# Patient Record
Sex: Female | Born: 1955 | Race: White | Hispanic: No | Marital: Married | Smoking: Never smoker
Health system: Southern US, Community
[De-identification: ages and names within clinical notes are randomized; demographics above are authoritative.]

## PROBLEM LIST (undated history)

## (undated) DIAGNOSIS — R51 Headache: Secondary | ICD-10-CM

## (undated) DIAGNOSIS — R519 Headache, unspecified: Secondary | ICD-10-CM

## (undated) DIAGNOSIS — K219 Gastro-esophageal reflux disease without esophagitis: Secondary | ICD-10-CM

## (undated) DIAGNOSIS — T753XXA Motion sickness, initial encounter: Secondary | ICD-10-CM

## (undated) DIAGNOSIS — E079 Disorder of thyroid, unspecified: Secondary | ICD-10-CM

## (undated) DIAGNOSIS — G4730 Sleep apnea, unspecified: Secondary | ICD-10-CM

## (undated) DIAGNOSIS — R42 Dizziness and giddiness: Secondary | ICD-10-CM

## (undated) HISTORY — DX: Disorder of thyroid, unspecified: E07.9

## (undated) HISTORY — PX: ABDOMINAL HYSTERECTOMY: SHX81

## (undated) HISTORY — DX: Sleep apnea, unspecified: G47.30

---

## 2004-09-23 ENCOUNTER — Ambulatory Visit: Payer: Self-pay | Admitting: Specialist

## 2005-08-05 ENCOUNTER — Ambulatory Visit: Payer: Self-pay

## 2007-07-29 HISTORY — PX: COLON SURGERY: SHX602

## 2008-05-04 ENCOUNTER — Ambulatory Visit: Payer: Self-pay | Admitting: Gastroenterology

## 2008-05-10 ENCOUNTER — Ambulatory Visit: Payer: Self-pay | Admitting: Family Medicine

## 2008-05-24 ENCOUNTER — Inpatient Hospital Stay: Payer: Self-pay | Admitting: General Surgery

## 2008-11-10 ENCOUNTER — Ambulatory Visit: Payer: Self-pay | Admitting: Family Medicine

## 2009-08-29 ENCOUNTER — Ambulatory Visit: Payer: Self-pay | Admitting: Family Medicine

## 2009-09-17 ENCOUNTER — Ambulatory Visit: Payer: Self-pay | Admitting: Family Medicine

## 2009-10-01 ENCOUNTER — Ambulatory Visit: Payer: Self-pay | Admitting: Gastroenterology

## 2009-10-04 DIAGNOSIS — M797 Fibromyalgia: Secondary | ICD-10-CM | POA: Insufficient documentation

## 2010-06-08 ENCOUNTER — Emergency Department: Payer: Self-pay | Admitting: Emergency Medicine

## 2010-10-16 ENCOUNTER — Ambulatory Visit: Payer: Self-pay | Admitting: Family Medicine

## 2011-08-26 ENCOUNTER — Ambulatory Visit: Payer: Self-pay | Admitting: Family Medicine

## 2012-10-19 ENCOUNTER — Ambulatory Visit: Payer: Self-pay | Admitting: Family Medicine

## 2014-01-03 ENCOUNTER — Encounter: Payer: Self-pay | Admitting: *Deleted

## 2014-01-23 ENCOUNTER — Encounter: Payer: Self-pay | Admitting: General Surgery

## 2014-01-24 ENCOUNTER — Ambulatory Visit: Payer: Self-pay | Admitting: General Surgery

## 2014-01-26 ENCOUNTER — Encounter: Payer: Self-pay | Admitting: *Deleted

## 2014-02-27 ENCOUNTER — Ambulatory Visit: Payer: Self-pay | Admitting: General Surgery

## 2014-03-23 ENCOUNTER — Encounter: Payer: Self-pay | Admitting: *Deleted

## 2015-01-08 ENCOUNTER — Other Ambulatory Visit: Payer: Self-pay | Admitting: Family Medicine

## 2015-01-08 NOTE — Telephone Encounter (Signed)
Pt needs refill

## 2015-01-08 NOTE — Telephone Encounter (Signed)
He needs to be seen, last visit in 05/2014 no labs since one year ago. I will send one month only

## 2015-01-23 ENCOUNTER — Other Ambulatory Visit: Payer: Self-pay | Admitting: Family Medicine

## 2015-01-23 DIAGNOSIS — Z79899 Other long term (current) drug therapy: Secondary | ICD-10-CM

## 2015-01-23 DIAGNOSIS — E785 Hyperlipidemia, unspecified: Secondary | ICD-10-CM

## 2015-01-23 DIAGNOSIS — R739 Hyperglycemia, unspecified: Secondary | ICD-10-CM

## 2015-01-23 DIAGNOSIS — E038 Other specified hypothyroidism: Secondary | ICD-10-CM

## 2015-01-23 NOTE — Telephone Encounter (Signed)
Pt needs a refill on her thyroid medication. She only has 2 left, but an appt sched for 03/05/15. She uses [facility] [address] She also needs to a lab slip generated so she can get her TSH checked before her appt. She would like for someone to call her and let her know when the slip is ready to pick up.

## 2015-01-24 ENCOUNTER — Other Ambulatory Visit: Payer: Self-pay | Admitting: Family Medicine

## 2015-01-24 ENCOUNTER — Other Ambulatory Visit: Payer: Self-pay

## 2015-01-24 DIAGNOSIS — E785 Hyperlipidemia, unspecified: Secondary | ICD-10-CM

## 2015-01-24 DIAGNOSIS — R739 Hyperglycemia, unspecified: Secondary | ICD-10-CM

## 2015-01-24 DIAGNOSIS — E038 Other specified hypothyroidism: Secondary | ICD-10-CM

## 2015-01-24 DIAGNOSIS — Z79899 Other long term (current) drug therapy: Secondary | ICD-10-CM

## 2015-01-24 MED ORDER — LEVOTHYROXINE SODIUM 88 MCG PO TABS: 88.0000 ug | ORAL_TABLET | Freq: Every day | ORAL | Status: DC

## 2015-01-24 NOTE — Progress Notes (Signed)
Patient notified and left lab slip upfront for patient to pick up.

## 2015-01-30 NOTE — Telephone Encounter (Signed)
Lab order done 

## 2015-01-30 NOTE — Addendum Note (Signed)
Addended by: Alba CorySOWLES, Megumi Treaster F on: 01/30/2015 08:21 AM   Modules accepted: Orders

## 2015-02-01 ENCOUNTER — Other Ambulatory Visit: Payer: Self-pay | Admitting: Family Medicine

## 2015-03-01 LAB — COMPREHENSIVE METABOLIC PANEL
A/G RATIO: 1.6 (ref 1.1–2.5)
ALT: 16 IU/L (ref 0–32)
AST: 18 IU/L (ref 0–40)
Albumin: 4.2 g/dL (ref 3.5–5.5)
Alkaline Phosphatase: 85 IU/L (ref 39–117)
BUN/Creatinine Ratio: 18 (ref 9–23)
BUN: 13 mg/dL (ref 6–24)
Bilirubin Total: 0.4 mg/dL (ref 0.0–1.2)
CHLORIDE: 100 mmol/L (ref 97–108)
CO2: 24 mmol/L (ref 18–29)
Calcium: 9.4 mg/dL (ref 8.7–10.2)
Creatinine, Ser: 0.74 mg/dL (ref 0.57–1.00)
GFR calc Af Amer: 103 mL/min/{1.73_m2} (ref >59)
GFR, EST NON AFRICAN AMERICAN: 89 mL/min/{1.73_m2} (ref >59)
Globulin, Total: 2.6 g/dL (ref 1.5–4.5)
Glucose: 97 mg/dL (ref 65–99)
POTASSIUM: 5.2 mmol/L (ref 3.5–5.2)
Sodium: 139 mmol/L (ref 134–144)
Total Protein: 6.8 g/dL (ref 6.0–8.5)

## 2015-03-01 LAB — HEMOGLOBIN A1C
ESTIMATED AVERAGE GLUCOSE: 111 mg/dL
HEMOGLOBIN A1C: 5.5 % (ref 4.8–5.6)

## 2015-03-01 LAB — LIPID PANEL
CHOL/HDL RATIO: 4.3 ratio (ref 0.0–4.4)
Cholesterol, Total: 208 mg/dL — ABNORMAL HIGH (ref 100–199)
HDL: 48 mg/dL (ref >39)
LDL CALC: 130 mg/dL — AB (ref 0–99)
Triglycerides: 149 mg/dL (ref 0–149)
VLDL Cholesterol Cal: 30 mg/dL (ref 5–40)

## 2015-03-01 LAB — TSH: TSH: 1.1 u[IU]/mL (ref 0.450–4.500)

## 2015-03-03 ENCOUNTER — Encounter: Payer: Self-pay | Admitting: Family Medicine

## 2015-03-03 DIAGNOSIS — E669 Obesity, unspecified: Secondary | ICD-10-CM | POA: Insufficient documentation

## 2015-03-03 DIAGNOSIS — E039 Hypothyroidism, unspecified: Secondary | ICD-10-CM | POA: Insufficient documentation

## 2015-03-03 DIAGNOSIS — Z90710 Acquired absence of both cervix and uterus: Secondary | ICD-10-CM | POA: Insufficient documentation

## 2015-03-03 DIAGNOSIS — Z8541 Personal history of malignant neoplasm of cervix uteri: Secondary | ICD-10-CM | POA: Insufficient documentation

## 2015-03-03 DIAGNOSIS — K219 Gastro-esophageal reflux disease without esophagitis: Secondary | ICD-10-CM | POA: Insufficient documentation

## 2015-03-03 DIAGNOSIS — E785 Hyperlipidemia, unspecified: Secondary | ICD-10-CM | POA: Insufficient documentation

## 2015-03-03 DIAGNOSIS — Z8679 Personal history of other diseases of the circulatory system: Secondary | ICD-10-CM | POA: Insufficient documentation

## 2015-03-03 DIAGNOSIS — K5909 Other constipation: Secondary | ICD-10-CM | POA: Insufficient documentation

## 2015-03-03 DIAGNOSIS — S0300XA Dislocation of jaw, unspecified side, initial encounter: Secondary | ICD-10-CM | POA: Insufficient documentation

## 2015-03-03 DIAGNOSIS — R945 Abnormal results of liver function studies: Secondary | ICD-10-CM | POA: Insufficient documentation

## 2015-03-03 DIAGNOSIS — M722 Plantar fascial fibromatosis: Secondary | ICD-10-CM | POA: Insufficient documentation

## 2015-03-03 DIAGNOSIS — R7989 Other specified abnormal findings of blood chemistry: Secondary | ICD-10-CM | POA: Insufficient documentation

## 2015-03-03 DIAGNOSIS — R739 Hyperglycemia, unspecified: Secondary | ICD-10-CM | POA: Insufficient documentation

## 2015-03-03 DIAGNOSIS — R160 Hepatomegaly, not elsewhere classified: Secondary | ICD-10-CM | POA: Insufficient documentation

## 2015-03-03 DIAGNOSIS — G4733 Obstructive sleep apnea (adult) (pediatric): Secondary | ICD-10-CM | POA: Insufficient documentation

## 2015-03-05 ENCOUNTER — Encounter (INDEPENDENT_AMBULATORY_CARE_PROVIDER_SITE_OTHER): Payer: Self-pay

## 2015-03-05 ENCOUNTER — Ambulatory Visit: Payer: Self-pay | Admitting: Family Medicine

## 2015-03-06 ENCOUNTER — Encounter: Payer: Self-pay | Admitting: Family Medicine

## 2015-03-06 ENCOUNTER — Ambulatory Visit (INDEPENDENT_AMBULATORY_CARE_PROVIDER_SITE_OTHER): Payer: BLUE CROSS/BLUE SHIELD | Admitting: Family Medicine

## 2015-03-06 VITALS — BP 130/78 | HR 91 | Temp 98.5°F | Resp 14 | Ht 65.0 in | Wt 206.5 lb

## 2015-03-06 DIAGNOSIS — E038 Other specified hypothyroidism: Secondary | ICD-10-CM

## 2015-03-06 DIAGNOSIS — K219 Gastro-esophageal reflux disease without esophagitis: Secondary | ICD-10-CM | POA: Diagnosis not present

## 2015-03-06 DIAGNOSIS — G4730 Sleep apnea, unspecified: Secondary | ICD-10-CM

## 2015-03-06 DIAGNOSIS — Z1239 Encounter for other screening for malignant neoplasm of breast: Secondary | ICD-10-CM | POA: Diagnosis not present

## 2015-03-06 DIAGNOSIS — E785 Hyperlipidemia, unspecified: Secondary | ICD-10-CM

## 2015-03-06 MED ORDER — LEVOTHYROXINE SODIUM 88 MCG PO TABS: 88.0000 ug | ORAL_TABLET | Freq: Every day | ORAL | Status: DC

## 2015-03-06 NOTE — Patient Instructions (Signed)

## 2015-03-06 NOTE — Progress Notes (Signed)
Name: Claudia Norris   MRN: [id number]    DOB: 1956/06/16   Date:03/06/2015       Progress Note  Subjective  Chief Complaint  Chief Complaint  Patient presents with  . Hypothyroidism  . Sleep Apnea    has been diagnosed but cannot wear the mask    HPI  Hypothyroidism: she is compliant with medications, TSH is at goal. Denies constipation, no hair loss, no dry skin.  Sleep Apnea: she had two titration studies, but never started on CPAP because of cost, she will check with insurance to see if she can get a home study and will call us back. She still snores, denies waking up with headaches.   Dyslipidemia: LDL 130, calculated her ASCVD and was 3.5% , we will not start statin  GERD: she has been taking Tums prn  at night to control symptoms. She has regurgitation about once a month  Patient Active Problem List   Diagnosis Date Noted  . Chronic constipation 03/03/2015  . Dyslipidemia 03/03/2015  . Gastro-esophageal reflux disease without esophagitis 03/03/2015  . Hepatomegaly 03/03/2015  . History of cervical cancer 03/03/2015  . H/O: HTN (hypertension) 03/03/2015  . H/O: hysterectomy 03/03/2015  . Blood glucose elevated 03/03/2015  . Adult hypothyroidism 03/03/2015  . Obesity (BMI 30.0-34.9) 03/03/2015  . Plantar fasciitis 03/03/2015  . Apnea, sleep 03/03/2015  . Closed dislocation of jaw 03/03/2015  . Fibromyalgia syndrome 10/04/2009    Past Surgical History  Procedure Laterality Date  . Abdominal hysterectomy    . Cesarean section    . Colon surgery  2009    removed some of colon    Family History  Problem Relation Age of Onset  . Hypertension Mother   . Hypothyroidism Mother   . Heart disease Mother     bypass  . Stroke Mother   . Arthritis Father     History   Social History  . Marital Status: Married    Spouse Name: N/A  . Number of Children: N/A  . Years of Education: N/A   Occupational History  . Not on file.   Social History Main Topics  .  Smoking status: Never Smoker   . Smokeless tobacco: Never Used  . Alcohol Use: No  . Drug Use: No  . Sexual Activity: No   Other Topics Concern  . Not on file   Social History Narrative  . No narrative on file     Current outpatient prescriptions:  .  levothyroxine (SYNTHROID) 88 MCG tablet, Take 1 tablet (88 mcg total) by mouth daily before breakfast., Disp: 90 tablet, Rfl: 1  No Known Allergies   ROS  Constitutional: Negative for fever or weight change.  Respiratory: Negative for cough and shortness of breath.   Cardiovascular: Negative for chest pain or palpitations.  Gastrointestinal: Negative for abdominal pain, no bowel changes.  Musculoskeletal: Negative for gait problem or joint swelling.  Skin: Negative for rash.  Neurological: Negative for dizziness or headache.  No other specific complaints in a complete review of systems (except as listed in HPI above).  Objective  Filed Vitals:   03/06/15 1048  BP: 130/78  Pulse: 91  Temp: 98.5 F (36.9 C)  TempSrc: Oral  Resp: 14  Height: 5\' 5"  (1.651 m)  Weight: 206 lb 8 oz (93.668 kg)  SpO2: 96%    Body mass index is 34.36 kg/(m^2).  Physical Exam   Constitutional: Patient appears well-developed and well-nourished. Obese  No distress.  HEENT: head  atraumatic, normocephalic, pupils equal and reactive to light,  neck supple, throat within normal limits Cardiovascular: Normal rate, regular rhythm and normal heart sounds.  No murmur heard. No BLE edema. Pulmonary/Chest: Effort normal and breath sounds normal. No respiratory distress. Abdominal: Soft.  There is no tenderness. Psychiatric: Patient has a normal mood and affect. behavior is normal. Judgment and thought content normal.  Recent Results (from the past 2160 hour(s))  TSH     Status: None   Collection Time: 02/28/15 10:56 AM  Result Value Ref Range   TSH 1.100 0.450 - 4.500 uIU/mL  Lipid Profile     Status: Abnormal   Collection Time: 02/28/15 10:56  AM  Result Value Ref Range   Cholesterol, Total 208 (H) 100 - 199 mg/dL   Triglycerides 161 0 - 149 mg/dL   HDL 48 >09 mg/dL    Comment: According to ATP-III Guidelines, HDL-C >59 mg/dL is considered a negative risk factor for CHD.    VLDL Cholesterol Cal 30 5 - 40 mg/dL   LDL Calculated 604 (H) 0 - 99 mg/dL   Chol/HDL Ratio 4.3 0.0 - 4.4 ratio units    Comment:                                   T. Chol/HDL Ratio                                             Men  Women                               1/2 Avg.Risk  3.4    3.3                                   Avg.Risk  5.0    4.4                                2X Avg.Risk  9.6    7.1                                3X Avg.Risk 23.4   11.0   Comprehensive Metabolic Panel (CMET)     Status: None   Collection Time: 02/28/15 10:56 AM  Result Value Ref Range   Glucose 97 65 - 99 mg/dL   BUN 13 6 - 24 mg/dL   Creatinine, Ser 5.40 0.57 - 1.00 mg/dL   GFR calc non Af Amer 89 >59 mL/min/1.73   GFR calc Af Amer 103 >59 mL/min/1.73   BUN/Creatinine Ratio 18 9 - 23   Sodium 139 134 - 144 mmol/L   Potassium 5.2 3.5 - 5.2 mmol/L   Chloride 100 97 - 108 mmol/L   CO2 24 18 - 29 mmol/L   Calcium 9.4 8.7 - 10.2 mg/dL   Total Protein 6.8 6.0 - 8.5 g/dL   Albumin 4.2 3.5 - 5.5 g/dL   Globulin, Total 2.6 1.5 - 4.5 g/dL   Albumin/Globulin Ratio 1.6 1.1 - 2.5   Bilirubin Total 0.4 0.0 - 1.2 mg/dL  Alkaline Phosphatase 85 39 - 117 IU/L   AST 18 0 - 40 IU/L   ALT 16 0 - 32 IU/L  HgB A1c     Status: None   Collection Time: 02/28/15 10:56 AM  Result Value Ref Range   Hgb A1c MFr Bld 5.5 4.8 - 5.6 %    Comment:          Pre-diabetes: 5.7 - 6.4          Diabetes: >6.4          Glycemic control for adults with diabetes: <7.0    Est. average glucose Bld gHb Est-mCnc 111 mg/dL     ZOX0/9: Depression screen PHQ 2/9 03/06/2015  Decreased Interest 0  Down, Depressed, Hopeless 0  PHQ - 2 Score 0     Fall Risk: Fall Risk  03/06/2015  Falls in the  past year? No      Assessment & Plan  1. Apnea, sleep Explained importance of starting CPAP to decrease chance of MI and CVA  2. Gastro-esophageal reflux disease without esophagitis Taking Tums prn and is doing well  3. Dyslipidemia Discussed life style modification  4. Other specified hypothyroidism TSH is at goal  - levothyroxine (SYNTHROID) 88 MCG tablet; Take 1 tablet (88 mcg total) by mouth daily before breakfast.  Dispense: 90 tablet; Refill: 1  5. Breast cancer screening  - MM Digital Screening; Future

## 2015-06-26 ENCOUNTER — Telehealth: Payer: Self-pay | Admitting: Family Medicine

## 2015-06-26 ENCOUNTER — Other Ambulatory Visit: Payer: Self-pay

## 2015-06-26 NOTE — Telephone Encounter (Signed)
Patient is completely out of Synthroid. She states that [facility] and realized that it is not working for her, therefore she would like the name brand Synthroid. States that you would probably have to call the prescription in because of change. [facility] [address]

## 2015-06-27 ENCOUNTER — Other Ambulatory Visit: Payer: Self-pay | Admitting: Family Medicine

## 2015-06-27 DIAGNOSIS — E038 Other specified hypothyroidism: Secondary | ICD-10-CM

## 2015-06-27 MED ORDER — SYNTHROID 88 MCG PO TABS: 88.0000 ug | ORAL_TABLET | Freq: Every day | ORAL | Status: DC

## 2015-06-27 NOTE — Progress Notes (Signed)
Sent 30 days, but she needs follow up

## 2015-06-27 NOTE — Telephone Encounter (Signed)
LMOM to inform pt °

## 2015-06-27 NOTE — Telephone Encounter (Signed)
Sent 30 days, but needs follow up 

## 2015-06-28 NOTE — Progress Notes (Signed)
Called to inform patient to schedule appointment that her prescription has been called in. She wanted to ask you why does she have to come back with in 30 days. Stated that she was just seen in August. I informed her that most people have to be seen at least every 3-4 months and she stated that it should be every 6-12 months that it becomes to expensive coming in all the time. I told her that I will ask you and someone will return her call.  Please advise.

## 2015-06-28 NOTE — Progress Notes (Signed)
On my note it says return in 4 months, but we can change, return in Feb.

## 2015-08-01 ENCOUNTER — Telehealth: Payer: Self-pay

## 2015-08-01 NOTE — Telephone Encounter (Signed)
Yes, she can get all 3 vaccines at the same time

## 2015-08-01 NOTE — Telephone Encounter (Signed)
Spoke with pt and she stated she will be in next week for her influenza vaccination but she also wanted to know about getting th pneumococcal and varicella zoster. She asked if she could get there Varicella Zoster injection since she has never had the Chicken Pox and with her husband currently having Shingles. I told her that I would ask Dr. Carlynn PurlSowles and will give her a call back.

## 2015-08-02 ENCOUNTER — Telehealth: Payer: Self-pay

## 2015-08-02 NOTE — Telephone Encounter (Signed)
Patient was informed of Dr. Arty BaumgartnerSowles's message (Yes, she can get all 3 vaccines at the same time) and she said thank you for calling.

## 2015-09-24 ENCOUNTER — Other Ambulatory Visit: Payer: Self-pay | Admitting: Family Medicine

## 2015-09-24 DIAGNOSIS — E038 Other specified hypothyroidism: Secondary | ICD-10-CM

## 2015-09-24 NOTE — Telephone Encounter (Signed)
Appointment made for 10-22-15. She is also requesting to pick up a lab order the week before her appointment

## 2015-09-24 NOTE — Telephone Encounter (Signed)
Ordered TSH, all of her labs were done in 08 and there is no need to repeat it at this time

## 2015-09-24 NOTE — Addendum Note (Signed)
Addended by: Alba Cory F on: 09/24/2015 04:40 PM   Modules accepted: Orders

## 2015-09-24 NOTE — Telephone Encounter (Signed)
Patient requesting refill. 

## 2015-10-02 ENCOUNTER — Other Ambulatory Visit: Payer: Self-pay

## 2015-10-02 NOTE — Telephone Encounter (Signed)
Patient called stating she was not able to pick up her medication due to it being generic and [employer] will only pay for Brand not generic.  Refill request was sent to Dr. Alba CoryKrichna Sowles for approval and submission.

## 2015-10-03 MED ORDER — SYNTHROID 88 MCG PO TABS: 88.0000 ug | ORAL_TABLET | Freq: Every day | ORAL | Status: DC

## 2015-10-22 ENCOUNTER — Ambulatory Visit: Payer: BLUE CROSS/BLUE SHIELD | Admitting: Family Medicine

## 2015-10-30 ENCOUNTER — Encounter: Payer: Self-pay | Admitting: Family Medicine

## 2015-10-30 ENCOUNTER — Ambulatory Visit (INDEPENDENT_AMBULATORY_CARE_PROVIDER_SITE_OTHER): Payer: BLUE CROSS/BLUE SHIELD | Admitting: Family Medicine

## 2015-10-30 ENCOUNTER — Other Ambulatory Visit: Payer: Self-pay | Admitting: Family Medicine

## 2015-10-30 VITALS — BP 118/68 | HR 82 | Temp 98.7°F | Resp 18 | Ht 65.0 in | Wt 196.6 lb

## 2015-10-30 DIAGNOSIS — G4730 Sleep apnea, unspecified: Secondary | ICD-10-CM | POA: Diagnosis not present

## 2015-10-30 DIAGNOSIS — E669 Obesity, unspecified: Secondary | ICD-10-CM | POA: Diagnosis not present

## 2015-10-30 DIAGNOSIS — E785 Hyperlipidemia, unspecified: Secondary | ICD-10-CM

## 2015-10-30 DIAGNOSIS — E038 Other specified hypothyroidism: Secondary | ICD-10-CM

## 2015-10-30 DIAGNOSIS — Z23 Encounter for immunization: Secondary | ICD-10-CM

## 2015-10-30 DIAGNOSIS — E66811 Obesity, class 1: Secondary | ICD-10-CM

## 2015-10-30 DIAGNOSIS — K219 Gastro-esophageal reflux disease without esophagitis: Secondary | ICD-10-CM | POA: Diagnosis not present

## 2015-10-30 LAB — TSH: TSH: 0.289 u[IU]/mL — AB (ref 0.450–4.500)

## 2015-10-30 NOTE — Progress Notes (Signed)
Name: Claudia Norris   MRN: [id number]    DOB: 1955-08-25   Date:10/30/2015       Progress Note  Subjective  Chief Complaint  Chief Complaint  Patient presents with  . Thyroid Problem    pt here for medication refill and to discuss resent results    HPI  Hypothyroidism: she is compliant with medications, TSH is suppressed.  She has lost 10 lbs since last visit. She denies tachycardia or palpitation  Sleep Apnea: she had two titration studies, but never started on CPAP because of cost, she checked with insurance but does not want the mask will discuss with her dentist. She still snores, not having headaches.   Dyslipidemia: LDL 130, calculated her ASCVD and was 3.5% , we will not start statin  GERD: she has been taking Tums prnat most once a week  at night to control symptoms. She has regurgitation about once a month  Obesity: she has lost 10 lbs, she states her mother was diagnosed with anal cancer about 10 years ago. 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 Husband is starting a new business. She is also walking daily and trying to take care of herself.    Patient Active Problem List   Diagnosis Date Noted  . Dyslipidemia 03/03/2015  . Gastro-esophageal reflux disease without esophagitis 03/03/2015  . Hepatomegaly 03/03/2015  . History of cervical cancer 03/03/2015  . H/O: HTN (hypertension) 03/03/2015  . H/O: hysterectomy 03/03/2015  . Blood glucose elevated 03/03/2015  . Adult hypothyroidism 03/03/2015  . Obesity (BMI 30.0-34.9) 03/03/2015  . Plantar fasciitis 03/03/2015  . Apnea, sleep 03/03/2015  . Closed dislocation of jaw 03/03/2015  . Fibromyalgia syndrome 10/04/2009    Past Surgical History  Procedure Laterality Date  . Abdominal hysterectomy    . Cesarean section    . Colon surgery  2009    removed some of colon    Family History  Problem Relation Age of Onset  . Hypertension  Mother   . Hypothyroidism Mother   . Heart disease Mother     bypass  . Stroke Mother   . Cancer - Other Mother   . Arthritis Father     Social History   Social History  . Marital Status: Married    Spouse Name: N/A  . Number of Children: N/A  . Years of Education: N/A   Occupational History  . Not on file.   Social History Main Topics  . Smoking status: Never Smoker   . Smokeless tobacco: Never Used  . Alcohol Use: No  . Drug Use: No  . Sexual Activity: No   Other Topics Concern  . Not on file   Social History Narrative     Current outpatient prescriptions:  .  SYNTHROID 88 MCG tablet, Take 1 tablet (88 mcg total) by mouth daily before breakfast., Disp: 90 tablet, Rfl: 1  No Known Allergies   ROS  Constitutional: Negative for fever, positive for  weight change.  Respiratory: Negative for cough and shortness of breath.   Cardiovascular: Negative for chest pain or palpitations.  Gastrointestinal: Negative for abdominal pain, no bowel changes.  Musculoskeletal: Negative for gait problem or joint swelling.  Skin: Negative for rash.  Neurological: Negative for dizziness or headache.  No other specific complaints in a complete review of systems (except as listed in HPI above).  Objective  Filed Vitals:  10/30/15 1052  BP: 118/68  Pulse: 82  Temp: 98.7 F (37.1 C)  Resp: 18  Height:  (1.651 m)  Weight: 196 lb 9 oz (89.16 kg)  SpO2: 97%    Body mass index is 32.71 kg/(m^2).  Physical Exam  Constitutional: Patient appears well-developed and well-nourished. Obese No distress.  HEENT: head atraumatic, normocephalic, pupils equal and reactive to light,  neck supple, throat within normal limits Cardiovascular: Normal rate, regular rhythm and normal heart sounds.  No murmur heard. No BLE edema. Pulmonary/Chest: Effort normal and breath sounds normal. No respiratory distress. Abdominal: Soft.  There is no tenderness. Psychiatric: Patient has a normal  mood and affect. behavior is normal. Judgment and thought content normal.  Recent Results (from the past 2160 hour(s))  TSH     Status: Abnormal   Collection Time: 10/29/15 10:15 AM  Result Value Ref Range   TSH 0.289 (L) 0.450 - 4.500 uIU/mL    PHQ2/9: Depression screen PHQ 2/9 03/06/2015  Decreased Interest 0  Down, Depressed, Hopeless 0  PHQ - 2 Score 0     Fall Risk: Fall Risk  03/06/2015  Falls in the past year? No     Assessment & Plan  1. Other specified hypothyroidism  - TSH  2. Dyslipidemia  On diet only, she lost weight  3. Apnea, sleep  She refuses CPAP   4. Gastro-esophageal reflux disease without esophagitis  Doing well on Tums prn   5. Obesity (BMI 30.0-34.9)  Discussed with the patient the risk posed by an increased BMI. Discussed importance of portion control, calorie counting and at least 150 minutes of physical activity weekly. Avoid sweet beverages and drink more water. Eat at least 6 servings of fruit and vegetables daily   6. Need for shingles vaccine  - Varicella-zoster vaccine subcutaneous  7. Needs flu shot  - Flu Vaccine QUAD 36+ mos IM

## 2015-11-06 ENCOUNTER — Other Ambulatory Visit: Payer: Self-pay | Admitting: Family Medicine

## 2015-11-06 NOTE — Telephone Encounter (Signed)
Patient requesting refill. 

## 2016-02-14 ENCOUNTER — Other Ambulatory Visit: Payer: Self-pay

## 2016-02-14 NOTE — Telephone Encounter (Signed)
Got a fax from [facility] stating this patient is requesting a refill of her Synthroid 88mcg, must be brand name.  Refill request was sent to Dr. Alba CoryKrichna Sowles for approval and submission.

## 2016-02-29 ENCOUNTER — Telehealth: Payer: Self-pay | Admitting: Gastroenterology

## 2016-02-29 ENCOUNTER — Other Ambulatory Visit: Payer: Self-pay | Admitting: Family Medicine

## 2016-02-29 ENCOUNTER — Other Ambulatory Visit: Payer: Self-pay

## 2016-02-29 ENCOUNTER — Ambulatory Visit (INDEPENDENT_AMBULATORY_CARE_PROVIDER_SITE_OTHER): Payer: BLUE CROSS/BLUE SHIELD | Admitting: Family Medicine

## 2016-02-29 ENCOUNTER — Encounter: Payer: Self-pay | Admitting: Family Medicine

## 2016-02-29 VITALS — BP 120/70 | HR 81 | Temp 98.4°F | Resp 16 | Ht 65.0 in | Wt 196.2 lb

## 2016-02-29 DIAGNOSIS — E669 Obesity, unspecified: Secondary | ICD-10-CM

## 2016-02-29 DIAGNOSIS — Z1211 Encounter for screening for malignant neoplasm of colon: Secondary | ICD-10-CM | POA: Diagnosis not present

## 2016-02-29 DIAGNOSIS — R8290 Unspecified abnormal findings in urine: Secondary | ICD-10-CM | POA: Diagnosis not present

## 2016-02-29 DIAGNOSIS — Z1239 Encounter for other screening for malignant neoplasm of breast: Secondary | ICD-10-CM | POA: Diagnosis not present

## 2016-02-29 DIAGNOSIS — Z01419 Encounter for gynecological examination (general) (routine) without abnormal findings: Secondary | ICD-10-CM

## 2016-02-29 DIAGNOSIS — E038 Other specified hypothyroidism: Secondary | ICD-10-CM

## 2016-02-29 DIAGNOSIS — Z800 Family history of malignant neoplasm of digestive organs: Secondary | ICD-10-CM | POA: Insufficient documentation

## 2016-02-29 DIAGNOSIS — Z131 Encounter for screening for diabetes mellitus: Secondary | ICD-10-CM

## 2016-02-29 DIAGNOSIS — Z0000 Encounter for general adult medical examination without abnormal findings: Secondary | ICD-10-CM

## 2016-02-29 DIAGNOSIS — Z8541 Personal history of malignant neoplasm of cervix uteri: Secondary | ICD-10-CM | POA: Diagnosis not present

## 2016-02-29 DIAGNOSIS — G4730 Sleep apnea, unspecified: Secondary | ICD-10-CM

## 2016-02-29 DIAGNOSIS — K219 Gastro-esophageal reflux disease without esophagitis: Secondary | ICD-10-CM

## 2016-02-29 DIAGNOSIS — E785 Hyperlipidemia, unspecified: Secondary | ICD-10-CM

## 2016-02-29 LAB — COMPLETE METABOLIC PANEL WITH GFR
ALBUMIN: 3.9 g/dL (ref 3.6–5.1)
ALK PHOS: 76 U/L (ref 33–130)
ALT: 16 U/L (ref 6–29)
AST: 19 U/L (ref 10–35)
BILIRUBIN TOTAL: 0.4 mg/dL (ref 0.2–1.2)
BUN: 15 mg/dL (ref 7–25)
CO2: 26 mmol/L (ref 20–31)
CREATININE: 0.78 mg/dL (ref 0.50–0.99)
Calcium: 9 mg/dL (ref 8.6–10.4)
Chloride: 105 mmol/L (ref 98–110)
GFR, Est African American: >89 mL/min (ref >=60)
GFR, Est Non African American: 83 mL/min (ref >=60)
GLUCOSE: 99 mg/dL (ref 65–99)
Potassium: 4.4 mmol/L (ref 3.5–5.3)
SODIUM: 138 mmol/L (ref 135–146)
TOTAL PROTEIN: 6.6 g/dL (ref 6.1–8.1)

## 2016-02-29 LAB — LIPID PANEL
Cholesterol: 186 mg/dL (ref 125–200)
HDL: 55 mg/dL (ref >=46)
LDL CALC: 109 mg/dL (ref <130)
Total CHOL/HDL Ratio: 3.4 Ratio (ref <=5.0)
Triglycerides: 111 mg/dL (ref <150)
VLDL: 22 mg/dL (ref <30)

## 2016-02-29 LAB — TSH: TSH: 0.34 m[IU]/L — AB

## 2016-02-29 LAB — HEMOGLOBIN A1C
HEMOGLOBIN A1C: 5.4 % (ref <5.7)
MEAN PLASMA GLUCOSE: 108 mg/dL

## 2016-02-29 MED ORDER — NA SULFATE-K SULFATE-MG SULF 17.5-3.13-1.6 GM/177ML PO SOLN: 1.0000 | Freq: Once | ORAL | 0 refills | Status: AC

## 2016-02-29 NOTE — Telephone Encounter (Signed)
colonoscopy

## 2016-02-29 NOTE — Telephone Encounter (Signed)
Called patient on her mobile and left a message. Home phone is actually her husbands cell phone

## 2016-02-29 NOTE — Progress Notes (Signed)
Name: Claudia Norris   MRN: [id number]    DOB: [birth year]   Date:02/29/2016       Progress Note  Subjective  Chief Complaint  Chief Complaint  Patient presents with  . Annual Exam  . 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 She is now looking for a part time job locally. Her mother is doing well at this time. She has a history of cervical cancer and needs repeat of pap smear. She is also due for mammogram and colonoscopy. Last intercourse about 6 months ago because of vaginal dryness and discomfort - but intimate with husband in other ways.   Dyslipidemia: on life style modification only, trying to walk more, no chest pain.   Hypothyroidism: taking Synthroid 88 mcg daily, but she is supposed to take half on Sundays. She denies palpitation or weight loss.   GERD: she has occasional regurgitation and heartburn, she takes Tums prn. Usually triggered by what she its.   Obesity: weight is stable now, she is trying to walk 5 days weekly, she is trying to eat more salads, and is trying to cut back on potato chips.   Urine odor and intermittent frequency: she has a history of frequent UTI, she has noticed if she drinks more water the odor improves. She denies dysuria, fever or back pain.    Patient Active Problem List   Diagnosis Date Noted  . Family history of rectal cancer 02/29/2016  . Dyslipidemia 03/03/2015  . Gastro-esophageal reflux disease without esophagitis 03/03/2015  . Hepatomegaly 03/03/2015  . History of cervical cancer 03/03/2015  . H/O: HTN (hypertension) 03/03/2015  . H/O: hysterectomy 03/03/2015  . Blood glucose elevated 03/03/2015  . Adult hypothyroidism 03/03/2015  . Obesity (BMI 30.0-34.9) 03/03/2015  . Plantar  fasciitis 03/03/2015  . Apnea, sleep 03/03/2015  . Closed dislocation of jaw 03/03/2015  . Fibromyalgia syndrome 10/04/2009    Past Surgical History:  Procedure Laterality Date  . ABDOMINAL HYSTERECTOMY    . CESAREAN SECTION    . COLON SURGERY  2009   removed some of colon    Family History  Problem Relation Age of Onset  . Hypertension Mother   . Hypothyroidism Mother   . Heart disease Mother     bypass  . Stroke Mother   . Cancer - Other Mother   . Arthritis Father     Social History   Social History  . Marital status: Married    Spouse name: N/A  . Number of children: N/A  . Years of education: N/A   Occupational History  . Not on file.   Social History Main Topics  . Smoking status: Never Smoker  . Smokeless tobacco: Never Used  . Alcohol use No  . Drug use: No  . Sexual activity: No   Other Topics Concern  . Not on file   Social History Narrative  . No narrative on file     Current Outpatient Prescriptions:  .  SYNTHROID 88 MCG tablet, Take 1 tablet (88 mcg total) by mouth daily before breakfast. And half on Sundays, Disp: 30 tablet, Rfl: 0  No Known Allergies   ROS  Constitutional: Negative  for fever or weight change.  Respiratory: Negative for cough and shortness of breath.   Cardiovascular: Negative for chest pain or palpitations.  Gastrointestinal: Negative for abdominal pain, no bowel changes.  Musculoskeletal: Negative for gait problem or joint swelling.  Skin: Negative for rash.  Neurological: Negative for dizziness or headache.  No other specific complaints in a complete review of systems (except as listed in HPI above).  Objective  Vitals:   02/29/16 0818  BP: 120/70  Pulse: 81  Resp: 16  Temp: 98.4 F (36.9 C)  TempSrc: Oral  SpO2: 97%  Weight: 196 lb 3.2 oz (89 kg)  Height: 5\' 5"  (1.651 m)    Body mass index is 32.65 kg/m.  Physical Exam  Constitutional: Patient appears well-developed and well-nourished. No  distress.  HENT: Head: Normocephalic and atraumatic. Ears: B TMs ok, no erythema or effusion; Nose: Nose normal. Mouth/Throat: Oropharynx is clear and moist. No oropharyngeal exudate.  Eyes: Conjunctivae and EOM are normal. Pupils are equal, round, and reactive to light. No scleral icterus.  Neck: Normal range of motion. Neck supple. No JVD present. No thyromegaly present.  Cardiovascular: Normal rate, regular rhythm and normal heart sounds.  No murmur heard. No BLE edema. Pulmonary/Chest: Effort normal and breath sounds normal. No respiratory distress. Abdominal: Soft. Bowel sounds are normal, no distension. There is no tenderness. no masses Breast: no lumps or masses, no nipple discharge or rashes FEMALE GENITALIA:  External genitalia normal External urethra normal Vaginal vault normal without discharge or lesions Cervix absent  Bimanual exam normal without masses RECTAL: not done Musculoskeletal: Normal range of motion, no joint effusions. No gross deformities Neurological: he is alert and oriented to person, place, and time. No cranial nerve deficit. Coordination, balance, strength, speech and gait are normal.  Skin: Skin is warm and dry. No rash noted. No erythema.  Psychiatric: Patient has a normal mood and affect. behavior is normal. Judgment and thought content normal.   PHQ2/9: Depression screen PHQ 2/9 03/06/2015  Decreased Interest 0  Down, Depressed, Hopeless 0  PHQ - 2 Score 0     Fall Risk: Fall Risk  03/06/2015  Falls in the past year? No     Functional Status Survey: Is the patient deaf or have difficulty hearing?: No Does the patient have difficulty seeing, even when wearing glasses/contacts?: No Does the patient have difficulty concentrating, remembering, or making decisions?: No Does the patient have difficulty walking or climbing stairs?: No Does the patient have difficulty dressing or bathing?: No Does the patient have difficulty doing errands alone such as  visiting a doctor's office or shopping?: No    Assessment & Plan  1. Well woman exam  Discussed importance of 150 minutes of physical activity weekly, eat two servings of fish weekly, eat one serving of tree nuts ( cashews, pistachios, pecans, almonds.Marland Kitchen) every other day, eat 6 servings of fruit/vegetables daily and drink plenty of water and avoid sweet beverages.  - COMPLETE METABOLIC PANEL WITH GFR  2. Dyslipidemia  - Lipid panel  3. Apnea, sleep  She could not tolerate mask  4. Gastro-esophageal reflux disease without esophagitis  Continue prn Tums  5. Breast cancer screening  - MM Digital Screening; Future  6. Obesity (BMI 30.0-34.9)  Discussed with the patient the risk posed by an increased BMI. Discussed importance of portion control, calorie counting and at least 150 minutes of physical activity weekly. Avoid sweet beverages and drink more water. Eat at least 6 servings of fruit and vegetables  daily   7. Family history of rectal cancer  - Ambulatory referral to Gastroenterology  8. History of cervical cancer  - PapLb, HPV, rfx16/18  9. Colon cancer screening  - Ambulatory referral to Gastroenterology  10. DM (diabetes mellitus screen)  - Hemoglobin A1c  11. Other specified hypothyroidism  - TSH  12. Bad odor of urine  - Urine Culture

## 2016-02-29 NOTE — Telephone Encounter (Signed)
Screening Colonoscopy Z12.11 [facility] 04/18/2016 Please pre cert  

## 2016-02-29 NOTE — Telephone Encounter (Signed)
Gastroenterology Pre-Procedure Review  Request Date: 04/18/2016 Requesting Physician: Dr. Carlynn Purl   PATIENT REVIEW QUESTIONS: The patient responded to the following health history questions as indicated:    1. Are you having any GI issues? no 2. Do you have a personal history of Polyps? yes (pre cancerous ) 3. Do you have a family history of Colon Cancer or Polyps? yes (Rectal cancer) 4. Diabetes Mellitus? no 5. Joint replacements in the past 12 months?no 6. Major health problems in the past 3 months?no 7. Any artificial heart valves, MVP, or defibrillator?no    MEDICATIONS & ALLERGIES:    Patient reports the following regarding taking any anticoagulation/antiplatelet therapy:   Plavix, Coumadin, Eliquis, Xarelto, Lovenox, Pradaxa, Brilinta, or Effient? no Aspirin? no  Patient confirms/reports the following medications:  Current Outpatient Prescriptions  Medication Sig Dispense Refill  . SYNTHROID 88 MCG tablet Take 1 tablet (88 mcg total) by mouth daily before breakfast. And half on Sundays 30 tablet 0   No current facility-administered medications for this visit.     Patient confirms/reports the following allergies:  No Known Allergies  No orders of the defined types were placed in this encounter.   AUTHORIZATION INFORMATION Primary Insurance: 1D#: Group #:  Secondary Insurance: 1D#: Group #:  SCHEDULE INFORMATION: Date:  Time: Location:

## 2016-03-03 LAB — URINE CULTURE: Colony Count: >=100000

## 2016-03-05 LAB — PAPLB, HPV, RFX16/18
HPV, high-risk: NEGATIVE
PAP SMEAR COMMENT: 0

## 2016-03-07 ENCOUNTER — Other Ambulatory Visit: Payer: Self-pay | Admitting: Family Medicine

## 2016-03-07 DIAGNOSIS — N390 Urinary tract infection, site not specified: Secondary | ICD-10-CM | POA: Insufficient documentation

## 2016-03-07 DIAGNOSIS — E038 Other specified hypothyroidism: Secondary | ICD-10-CM

## 2016-03-07 MED ORDER — SYNTHROID 88 MCG PO TABS: 88.0000 ug | ORAL_TABLET | Freq: Every day | ORAL | 0 refills | Status: DC

## 2016-03-07 MED ORDER — NITROFURANTOIN MONOHYD MACRO 100 MG PO CAPS: 100.0000 mg | ORAL_CAPSULE | Freq: Two times a day (BID) | ORAL | 0 refills | Status: DC

## 2016-03-18 ENCOUNTER — Encounter: Payer: Self-pay | Admitting: Family Medicine

## 2016-03-18 ENCOUNTER — Ambulatory Visit: Payer: BLUE CROSS/BLUE SHIELD | Admitting: Family Medicine

## 2016-03-18 VITALS — BP 116/68 | HR 92 | Temp 99.1°F | Resp 18 | Ht 65.0 in | Wt 197.0 lb

## 2016-03-18 DIAGNOSIS — Z8541 Personal history of malignant neoplasm of cervix uteri: Secondary | ICD-10-CM

## 2016-03-18 NOTE — Progress Notes (Signed)
Name: Claudia Norris   MRN: [id number]    DOB: Oct 17, 1955   Date:03/18/2016       Progress Note  Subjective  Chief Complaint  Chief Complaint  Patient presents with  . Gynecologic Exam    return pap    HPI  History of cervical cancer and pap smear was unsatisfactory of evaluation on 03/10/2016 , she returned today for a repeat pap smear  Patient Active Problem List   Diagnosis Date Noted  . Urinary tract infection 03/07/2016  . Family history of rectal cancer 02/29/2016  . Dyslipidemia 03/03/2015  . Gastro-esophageal reflux disease without esophagitis 03/03/2015  . Hepatomegaly 03/03/2015  . History of cervical cancer 03/03/2015  . H/O: HTN (hypertension) 03/03/2015  . H/O: hysterectomy 03/03/2015  . Blood glucose elevated 03/03/2015  . Adult hypothyroidism 03/03/2015  . Obesity (BMI 30.0-34.9) 03/03/2015  . Plantar fasciitis 03/03/2015  . Apnea, sleep 03/03/2015  . Closed dislocation of jaw 03/03/2015  . Fibromyalgia syndrome 10/04/2009    Past Surgical History:  Procedure Laterality Date  . ABDOMINAL HYSTERECTOMY    . CESAREAN SECTION    . COLON SURGERY  2009   removed some of colon    Family History  Problem Relation Age of Onset  . Hypertension Mother   . Hypothyroidism Mother   . Heart disease Mother     bypass  . Stroke Mother   . Cancer - Other Mother   . Arthritis Father     Social History   Social History  . Marital status: Married    Spouse name: N/A  . Number of children: N/A  . Years of education: N/A   Occupational History  . Not on file.   Social History Main Topics  . Smoking status: Never Smoker  . Smokeless tobacco: Never Used  . Alcohol use No  . Drug use: No  . Sexual activity: No   Other Topics Concern  . Not on file   Social History Narrative  . No narrative on file     Current Outpatient Prescriptions:  .  aspirin EC 81 MG tablet, Take 81 mg by mouth daily., Disp: , Rfl:  .  SYNTHROID 88 MCG tablet, Take 1  tablet (88 mcg total) by mouth daily before breakfast. And half on Tuesdays and Thursday., Disp: 30 tablet, Rfl: 0  No Known Allergies   ROS  Not done  Objective  Vitals:   03/18/16 1510  BP: 116/68  Pulse: 92  Resp: 18  Temp: 99.1 F (37.3 C)  SpO2: 97%  Weight: 197 lb (89.4 kg)  Height: 5\' 5"  (1.651 m)    Body mass index is 32.78 kg/m.  Physical Exam  Vaginal atrophy, cervix absent  Recent Results (from the past 2160 hour(s))  PapLb, HPV, rfx16/18     Status: None   Collection Time: 02/29/16 12:00 AM  Result Value Ref Range   DIAGNOSIS: Comment     Comment: UNSATISFACTORY FOR EVALUATION.   Specimen adequacy: Comment     Comment: Specimen processed and examined, but unsatisfactory for evaluation of epithelial abnormality because of obscuring inflammatory exudate.    Performed by: Comment     Comment: Phillis HaggisAdam Daniels, Cytotechnologist (ASCP)   QC reviewed by: Comment     Comment: Dorothy SparkMichael Wells, Supervisory Cytotechnologist (ASCP)   PAP SMEAR COMMENT .    PATHOLOGIST PROVIDED ICD10: Comment     Comment: R87.615   Note: Comment     Comment: The Pap smear is a screening test designed  to aid in the detection of premalignant and malignant conditions of the uterine cervix.  It is not a diagnostic procedure and should not be used as the sole means of detecting cervical cancer.  Both false-positive and false-negative reports do occur.    HPV, high-risk Negative Negative    Comment: This high-risk HPV test detects thirteen high-risk types (16/18/31/33/35/39/45/51/52/56/58/59/68) without differentiation.   COMPLETE METABOLIC PANEL WITH GFR     Status: None   Collection Time: 02/29/16  9:20 AM  Result Value Ref Range   Sodium 138 135 - 146 mmol/L   Potassium 4.4 3.5 - 5.3 mmol/L   Chloride 105 98 - 110 mmol/L   CO2 26 20 - 31 mmol/L   Glucose, Bld 99 65 - 99 mg/dL   BUN 15 7 - 25 mg/dL   Creat 4.090.78 8.110.50 - 9.140.99 mg/dL    Comment:   For patients > or = 61 years of  age: The upper reference limit for Creatinine is approximately 13% higher for people identified as African-American.      Total Bilirubin 0.4 0.2 - 1.2 mg/dL   Alkaline Phosphatase 76 33 - 130 U/L   AST 19 10 - 35 U/L   ALT 16 6 - 29 U/L   Total Protein 6.6 6.1 - 8.1 g/dL   Albumin 3.9 3.6 - 5.1 g/dL   Calcium 9.0 8.6 - 78.210.4 mg/dL   GFR, Est African American >89 >=60 mL/min   GFR, Est Non African American 83 >=60 mL/min  Lipid panel     Status: None   Collection Time: 02/29/16  9:20 AM  Result Value Ref Range   Cholesterol 186 125 - 200 mg/dL   Triglycerides 956111 <213<150 mg/dL   HDL 55 >=08>=46 mg/dL   Total CHOL/HDL Ratio 3.4 <=5.0 Ratio   VLDL 22 <30 mg/dL   LDL Cholesterol 657109 <846<130 mg/dL    Comment:   Total Cholesterol/HDL Ratio:CHD Risk                        Coronary Heart Disease Risk Table                                        Men       Women          1/2 Average Risk              3.4        3.3              Average Risk              5.0        4.4           2X Average Risk              9.6        7.1           3X Average Risk             23.4       11.0 Use the calculated Patient Ratio above and the CHD Risk table  to determine the patient's CHD Risk.   TSH     Status: Abnormal   Collection Time: 02/29/16  9:20 AM  Result Value Ref Range   TSH 0.34 (L) mIU/L    Comment:   Reference Range   >  or = 20 Years  0.40-4.50   Pregnancy Range First trimester  0.26-2.66 Second trimester 0.55-2.73 Third trimester  0.43-2.91     Hemoglobin A1c     Status: None   Collection Time: 02/29/16  9:20 AM  Result Value Ref Range   Hgb A1c MFr Bld 5.4 <5.7 %    Comment:   For the purpose of screening for the presence of diabetes:   <5.7%       Consistent with the absence of diabetes 5.7-6.4 %   Consistent with increased risk for diabetes (prediabetes) >=6.5 %     Consistent with diabetes   This assay result is consistent with a decreased risk of diabetes.   Currently, no  consensus exists regarding use of hemoglobin A1c for diagnosis of diabetes in children.   According to American Diabetes Association (ADA) guidelines, hemoglobin A1c <7.0% represents optimal control in non-pregnant diabetic patients. Different metrics may apply to specific patient populations. Standards of Medical Care in Diabetes (ADA).      Mean Plasma Glucose 108 mg/dL    Comment: NR=NOT REPORTABLE,SEE COMMENT ORAL therapy:A cefazolin MIC of <32 predicts  susceptibility to the oral agents cefaclor, cefdinir,cefpodoxime,cefprozil,cefuroxime, cephalexin,and loracarbef when used for therapy  of uncomplicated UTIs due to E.coli,K.pneumomiae, and P.mirabilis. PARENTERAL therapy: A cefazolin MIC of >8 indicates resistance to parenteral cefazolin. An alternate test method must be performed to confirm susceptibility to parenteral cefazolin.   Urine culture     Status: None   Collection Time: 02/29/16  9:20 AM  Result Value Ref Range   Colony Count >=100,000 COLONIES/ML    Organism ID, Bacteria ESCHERICHIA COLI       Susceptibility   Escherichia coli -  (no method available)    AMPICILLIN <=2 Sensitive     AMOX/CLAVULANIC <=2 Sensitive     AMPICILLIN/SULBACTAM <=2 Sensitive     PIP/TAZO <=4 Sensitive     IMIPENEM <=0.25 Sensitive     CEFAZOLIN <=4 Not Reportable     CEFTRIAXONE <=1 Sensitive     CEFTAZIDIME <=1 Sensitive     CEFEPIME <=1 Sensitive     GENTAMICIN <=1 Sensitive     TOBRAMYCIN <=1 Sensitive     CIPROFLOXACIN <=0.25 Sensitive     LEVOFLOXACIN <=0.12 Sensitive     NITROFURANTOIN <=16 Sensitive     TRIMETH/SULFA <=20 Sensitive        Assessment & Plan  1. History of cervical cancer  - PAP, Thin Prep w/HPV rflx HPV Type 16/18

## 2016-03-27 LAB — PAPLB, HPV, RFX16/18
HPV, HIGH-RISK: NEGATIVE
PAP Smear Comment: 0

## 2016-04-11 ENCOUNTER — Encounter: Payer: Self-pay | Admitting: *Deleted

## 2016-04-14 ENCOUNTER — Telehealth: Payer: Self-pay | Admitting: Gastroenterology

## 2016-04-14 ENCOUNTER — Other Ambulatory Visit: Payer: Self-pay

## 2016-04-14 DIAGNOSIS — Z1211 Encounter for screening for malignant neoplasm of colon: Secondary | ICD-10-CM

## 2016-04-14 DIAGNOSIS — Z1212 Encounter for screening for malignant neoplasm of rectum: Principal | ICD-10-CM

## 2016-04-14 MED ORDER — PEG 3350-KCL-NABCB-NACL-NASULF 236 G PO SOLR: 4000.0000 mL | Freq: Once | ORAL | 0 refills | Status: AC

## 2016-04-14 NOTE — Telephone Encounter (Signed)
Please call patient, her Suprep is not covered by her insurance. She has a colonoscopy later this week.

## 2016-04-14 NOTE — Telephone Encounter (Signed)
Pt advised that a cheaper prep has been sent to her pharmacy. Pt had not received colonoscopy instructions so I have emailed these to her per her request.

## 2016-04-18 ENCOUNTER — Encounter
Admission: RE | Disposition: A | Discharge status: Home / Self Care | Payer: Self-pay | Source: Ambulatory Visit | Attending: Gastroenterology

## 2016-04-18 ENCOUNTER — Ambulatory Visit: Payer: BLUE CROSS/BLUE SHIELD | Admitting: Anesthesiology

## 2016-04-18 ENCOUNTER — Ambulatory Visit
Admission: RE | Admit: 2016-04-18 | Discharge: 2016-04-18 | Disposition: A | Discharge status: Home / Self Care | Payer: BLUE CROSS/BLUE SHIELD | Source: Ambulatory Visit | Attending: Gastroenterology | Admitting: Gastroenterology

## 2016-04-18 DIAGNOSIS — Z9049 Acquired absence of other specified parts of digestive tract: Secondary | ICD-10-CM | POA: Diagnosis not present

## 2016-04-18 DIAGNOSIS — Z860100 Personal history of colon polyps, unspecified: Secondary | ICD-10-CM

## 2016-04-18 DIAGNOSIS — Z1211 Encounter for screening for malignant neoplasm of colon: Secondary | ICD-10-CM | POA: Diagnosis present

## 2016-04-18 DIAGNOSIS — G4730 Sleep apnea, unspecified: Secondary | ICD-10-CM | POA: Diagnosis not present

## 2016-04-18 DIAGNOSIS — Z7982 Long term (current) use of aspirin: Secondary | ICD-10-CM | POA: Insufficient documentation

## 2016-04-18 DIAGNOSIS — E039 Hypothyroidism, unspecified: Secondary | ICD-10-CM | POA: Insufficient documentation

## 2016-04-18 DIAGNOSIS — K640 First degree hemorrhoids: Secondary | ICD-10-CM | POA: Insufficient documentation

## 2016-04-18 HISTORY — DX: Gastro-esophageal reflux disease without esophagitis: K21.9

## 2016-04-18 HISTORY — DX: Headache, unspecified: R51.9

## 2016-04-18 HISTORY — DX: Motion sickness, initial encounter: T75.3XXA

## 2016-04-18 HISTORY — DX: Dizziness and giddiness: R42

## 2016-04-18 HISTORY — PX: COLONOSCOPY WITH PROPOFOL: SHX5780

## 2016-04-18 HISTORY — DX: Headache: R51

## 2016-04-18 SURGERY — COLONOSCOPY WITH PROPOFOL
Anesthesia: Monitor Anesthesia Care | Wound class: Contaminated

## 2016-04-18 MED ORDER — STERILE WATER FOR IRRIGATION IR SOLN
Status: DC | PRN
  Administered 2016-04-18: 08:00:00

## 2016-04-18 MED ORDER — LACTATED RINGERS IV SOLN
INTRAVENOUS | Status: DC
  Administered 2016-04-18: 07:00:00 via INTRAVENOUS

## 2016-04-18 MED ORDER — OXYCODONE HCL 5 MG/5ML PO SOLN: 5.0000 mg | Freq: Once | ORAL | Status: DC | PRN

## 2016-04-18 MED ORDER — OXYCODONE HCL 5 MG PO TABS: 5.0000 mg | ORAL_TABLET | Freq: Once | ORAL | Status: DC | PRN

## 2016-04-18 MED ORDER — LIDOCAINE HCL (CARDIAC) 20 MG/ML IV SOLN
INTRAVENOUS | Status: DC | PRN
  Administered 2016-04-18: 40 mg via INTRAVENOUS

## 2016-04-18 MED ORDER — PROPOFOL 10 MG/ML IV BOLUS
INTRAVENOUS | Status: DC | PRN
  Administered 2016-04-18: 30 mg via INTRAVENOUS
  Administered 2016-04-18: 20 mg via INTRAVENOUS
  Administered 2016-04-18 (×4): 50 mg via INTRAVENOUS

## 2016-04-18 SURGICAL SUPPLY — 23 items

## 2016-04-18 NOTE — Anesthesia Postprocedure Evaluation (Signed)
Anesthesia Post Note  Patient: Claudia Norris  Procedure(s) Performed: Procedure(s) (LRB): COLONOSCOPY WITH PROPOFOL (N/A)  Patient location during evaluation: PACU Anesthesia Type: MAC Level of consciousness: awake and alert Pain management: pain level controlled Vital Signs Assessment: post-procedure vital signs reviewed and stable Respiratory status: spontaneous breathing, nonlabored ventilation, respiratory function stable and patient connected to nasal cannula oxygen Cardiovascular status: stable and blood pressure returned to baseline Anesthetic complications: no    Wednesday Ericsson

## 2016-04-18 NOTE — Anesthesia Preprocedure Evaluation (Signed)
Anesthesia Evaluation  Patient identified by MRN, date of birth, ID band  Reviewed: NPO status   History of Anesthesia Complications Negative for: history of anesthetic complications  Airway Mallampati: II  TM Distance: >3 FB Neck ROM: full    Dental no notable dental hx.    Pulmonary sleep apnea (no cpap) ,    Pulmonary exam normal        Cardiovascular Exercise Tolerance: Good negative cardio ROS Normal cardiovascular exam     Neuro/Psych  Headaches, negative psych ROS   GI/Hepatic Neg liver ROS, GERD  Controlled,  Endo/Other  Hypothyroidism   Renal/GU negative Renal ROS  negative genitourinary   Musculoskeletal   Abdominal   Peds  Hematology negative hematology ROS (+)   Anesthesia Other Findings   Reproductive/Obstetrics                             Anesthesia Physical Anesthesia Plan  ASA: II  Anesthesia Plan: MAC   Post-op Pain Management:    Induction:   Airway Management Planned:   Additional Equipment:   Intra-op Plan:   Post-operative Plan:   Informed Consent: I have reviewed the patients History and Physical, chart, labs and discussed the procedure including the risks, benefits and alternatives for the proposed anesthesia with the patient or authorized representative who has indicated his/her understanding and acceptance.     Plan Discussed with: CRNA  Anesthesia Plan Comments:         Anesthesia Quick Evaluation

## 2016-04-18 NOTE — Transfer of Care (Signed)
Immediate Anesthesia Transfer of Care Note  Patient: Claudia Norris  Procedure(s) Performed: Procedure(s) with comments: COLONOSCOPY WITH PROPOFOL (N/A) - sleep apnea  Patient Location: PACU  Anesthesia Type: MAC  Level of Consciousness: awake, alert  and patient cooperative  Airway and Oxygen Therapy: Patient Spontanous Breathing and Patient connected to supplemental oxygen  Post-op Assessment: Post-op Vital signs reviewed, Patient's Cardiovascular Status Stable, Respiratory Function Stable, Patent Airway and No signs of Nausea or vomiting  Post-op Vital Signs: Reviewed and stable  Complications: No apparent anesthesia complications

## 2016-04-18 NOTE — H&P (Signed)
  Midge Miniumarren Kailen Name, MD [facility] [address] [place], [place] [address] Phone: [phone] Fax : [phone]  Primary Care Physician:  Ruel FavorsKrichna F Sowles, MD Primary Gastroenterologist:  Dr. Servando SnareWohl  Pre-Procedure History & Physical: HPI:  Claudia Norris is a 60 y.o. female is here for an colonoscopy.   Past Medical History:  Diagnosis Date  . GERD (gastroesophageal reflux disease)   . Headache    tension  . Motion sickness    car  . Sleep apnea    no cpap  . Thyroid disease   . Vertigo    in past    Past Surgical History:  Procedure Laterality Date  . ABDOMINAL HYSTERECTOMY    . CESAREAN SECTION    . COLON SURGERY  2009   removed some of colon    Prior to Admission medications   Medication Sig Start Date End Date Taking? Authorizing Provider  Nutritional Supplements (JUICE PLUS FIBRE PO) Take by mouth.   Yes Historical Provider, MD  SYNTHROID 88 MCG tablet Take 1 tablet (88 mcg total) by mouth daily before breakfast. And half on Tuesdays and Thursday. 03/07/16  Yes Alba CoryKrichna Sowles, MD  aspirin EC 81 MG tablet Take 81 mg by mouth daily.    Historical Provider, MD    Allergies as of 02/29/2016  . (No Known Allergies)    Family History  Problem Relation Age of Onset  . Hypertension Mother   . Hypothyroidism Mother   . Heart disease Mother     bypass  . Stroke Mother   . Cancer - Other Mother   . Arthritis Father     Social History   Social History  . Marital status: Married    Spouse name: N/A  . Number of children: N/A  . Years of education: N/A   Occupational History  . Not on file.   Social History Main Topics  . Smoking status: Never Smoker  . Smokeless tobacco: Never Used  . Alcohol use No  . Drug use: No  . Sexual activity: No   Other Topics Concern  . Not on file   Social History Narrative  . No narrative on file    Review of Systems: See HPI, otherwise negative ROS  Physical Exam: BP 130/77   Pulse 64   Temp 97.9 F (36.6 C)  (Temporal)   Resp 16   Ht 5\' 5"  (1.651 m)   Wt 194 lb (88 kg)   SpO2 97%   BMI 32.28 kg/m  General:   Alert,  pleasant and cooperative in NAD Head:  Normocephalic and atraumatic. Neck:  Supple; no masses or thyromegaly. Lungs:  Clear throughout to auscultation.    Heart:  Regular rate and rhythm. Abdomen:  Soft, nontender and nondistended. Normal bowel sounds, without guarding, and without rebound.   Neurologic:  Alert and  oriented x4;  grossly normal neurologically.  Impression/Plan: Claudia Norris is here for an colonoscopy to be performed for history of colon polyps  Risks, benefits, limitations, and alternatives regarding  colonoscopy have been reviewed with the patient.  Questions have been answered.  All parties agreeable.   [name], MD  04/18/2016, 7:27 AM

## 2016-04-18 NOTE — Discharge Instructions (Signed)

## 2016-04-18 NOTE — Op Note (Signed)
[facility] Gastroenterology Patient Name: Claudia Norris Procedure Date: 04/18/2016 8:05 AM MRN: [id number] Account #: [id number] Date of Birth: February 13, 1956 Admit Type: Outpatient Age: [age] [facility] Gender: Female Note Status: Finalized Procedure:            Colonoscopy Indications:          High risk colon cancer surveillance: Personal history                        of colonic polyps Providers:            Midge Miniumarren Abe Schools MD, MD Referring MD:         Onnie BoerKrichna F. Sowles, MD (Referring MD) Medicines:            Propofol per Anesthesia Complications:        No immediate complications. Procedure:            Pre-Anesthesia Assessment:                       - Prior to the procedure, a History and Physical was                        performed, and patient medications and allergies were                        reviewed. The patient's tolerance of previous                        anesthesia was also reviewed. The risks and benefits of                        the procedure and the sedation options and risks were                        discussed with the patient. All questions were                        answered, and informed consent was obtained. Prior                        Anticoagulants: The patient has taken no previous                        anticoagulant or antiplatelet agents. ASA Grade                        Assessment: II - A patient with mild systemic disease.                        After reviewing the risks and benefits, the patient was                        deemed in satisfactory condition to undergo the                        procedure.                       After obtaining informed consent, the colonoscope was  passed under direct vision. Throughout the procedure,                        the patient's blood pressure, pulse, and oxygen                        saturations were monitored continuously. The was   introduced through the anus and advanced to the the                        ileocolonic anastomosis. The colonoscopy was performed                        without difficulty. The patient tolerated the procedure                        well. The quality of the bowel preparation was                        excellent. Findings:      The perianal and digital rectal examinations were normal.      Non-bleeding internal hemorrhoids were found during retroflexion. The       hemorrhoids were Grade I (internal hemorrhoids that do not prolapse).      There was evidence of a prior end-to-side colo-colonic anastomosis in       the transverse colon. This was patent and was characterized by healthy       appearing mucosa. The anastomosis was traversed. Impression:           - Non-bleeding internal hemorrhoids.                       - Patent end-to-side colo-colonic anastomosis,                        characterized by healthy appearing mucosa.                       - No specimens collected. Recommendation:       - Discharge patient to home.                       - Resume previous diet.                       - Repeat colonoscopy in 5 years for surveillance. Procedure Code(s):    --- Professional ---                       [phone], Colonoscopy, flexible; diagnostic, including                        collection of specimen(s) by brushing or washing, when                        performed (separate procedure) Diagnosis Code(s):    --- Professional ---                       Z86.010, Personal history of colonic polyps CPT copyright 2016 American Medical Association. All rights reserved. The codes documented in this report are preliminary and upon coder review may  be revised to meet current compliance requirements.  Midge Minium MD, MD 04/18/2016 8:28:44 AM This report has been signed electronically. Number of Addenda: 0 Note Initiated On: 04/18/2016 8:05 AM Scope Withdrawal Time: 0 hours 5 minutes 10 seconds  Total  Procedure Duration: 0 hours 8 minutes 53 seconds       [facility]

## 2016-04-21 ENCOUNTER — Encounter: Payer: Self-pay | Admitting: Gastroenterology

## 2016-05-09 ENCOUNTER — Other Ambulatory Visit: Payer: Self-pay | Admitting: Family Medicine

## 2016-05-09 DIAGNOSIS — E038 Other specified hypothyroidism: Secondary | ICD-10-CM

## 2016-05-09 NOTE — Telephone Encounter (Signed)
Patient requesting refill of Synthroid to [facility]. 

## 2016-08-13 ENCOUNTER — Other Ambulatory Visit: Payer: Self-pay | Admitting: Family Medicine

## 2016-08-13 DIAGNOSIS — E038 Other specified hypothyroidism: Secondary | ICD-10-CM

## 2016-09-04 ENCOUNTER — Other Ambulatory Visit: Payer: Self-pay | Admitting: Family Medicine

## 2016-09-04 ENCOUNTER — Telehealth: Payer: Self-pay | Admitting: Family Medicine

## 2016-09-04 DIAGNOSIS — E038 Other specified hypothyroidism: Secondary | ICD-10-CM

## 2016-09-04 MED ORDER — SYNTHROID 88 MCG PO TABS: ORAL_TABLET | ORAL | 0 refills | Status: DC

## 2016-09-04 NOTE — Telephone Encounter (Signed)
PT SAID THAT SHE HAS ONLY A FEW DAYS OF HER THYROID MEDICATION  LEFT. SHE IS ALSO NEEDING TO HAVE BLOOD WORK TO CHECK THIS . I AM SCHEDULING HER AN FIRST AVAILABLE APPT, BUT NEEDS HER MEDICATION.

## 2016-09-04 NOTE — Telephone Encounter (Signed)
Sent refill

## 2016-09-05 ENCOUNTER — Telehealth: Payer: Self-pay | Admitting: Family Medicine

## 2016-09-05 NOTE — Telephone Encounter (Signed)
Patient notified last slip is upfront and ready. Patient was suppose to have it done around April 27, 2016 due to suppressed TSH.

## 2016-09-05 NOTE — Telephone Encounter (Signed)
Pt would like to know if she can get a order for her TSH so she can get labs done before her appt.

## 2016-09-18 ENCOUNTER — Ambulatory Visit: Payer: BLUE CROSS/BLUE SHIELD | Admitting: Family Medicine

## 2016-10-13 ENCOUNTER — Telehealth: Payer: Self-pay | Admitting: Family Medicine

## 2016-10-13 NOTE — Telephone Encounter (Signed)
Have appt for Wednesday morning but would like to come in the morning to do labs for thyroid check and whatever else you would like to order.

## 2016-10-14 ENCOUNTER — Other Ambulatory Visit: Payer: Self-pay

## 2016-10-14 ENCOUNTER — Other Ambulatory Visit: Payer: Self-pay | Admitting: Family Medicine

## 2016-10-14 DIAGNOSIS — E038 Other specified hypothyroidism: Secondary | ICD-10-CM

## 2016-10-14 LAB — TSH: TSH: 0.46 mIU/L

## 2016-10-14 NOTE — Telephone Encounter (Signed)
She just needs TSH, does not need to fast.

## 2016-10-14 NOTE — Telephone Encounter (Signed)
Printed and placed up front please notify pt

## 2016-10-15 ENCOUNTER — Encounter: Payer: Self-pay | Admitting: Family Medicine

## 2016-10-15 ENCOUNTER — Ambulatory Visit (INDEPENDENT_AMBULATORY_CARE_PROVIDER_SITE_OTHER): Payer: BLUE CROSS/BLUE SHIELD | Admitting: Family Medicine

## 2016-10-15 VITALS — BP 122/78 | HR 79 | Temp 97.7°F | Resp 16 | Ht 65.0 in | Wt 205.2 lb

## 2016-10-15 DIAGNOSIS — R8290 Unspecified abnormal findings in urine: Secondary | ICD-10-CM

## 2016-10-15 DIAGNOSIS — E038 Other specified hypothyroidism: Secondary | ICD-10-CM

## 2016-10-15 DIAGNOSIS — E785 Hyperlipidemia, unspecified: Secondary | ICD-10-CM | POA: Diagnosis not present

## 2016-10-15 DIAGNOSIS — K219 Gastro-esophageal reflux disease without esophagitis: Secondary | ICD-10-CM | POA: Diagnosis not present

## 2016-10-15 DIAGNOSIS — Z23 Encounter for immunization: Secondary | ICD-10-CM | POA: Diagnosis not present

## 2016-10-15 DIAGNOSIS — G4733 Obstructive sleep apnea (adult) (pediatric): Secondary | ICD-10-CM | POA: Diagnosis not present

## 2016-10-15 DIAGNOSIS — Z1239 Encounter for other screening for malignant neoplasm of breast: Secondary | ICD-10-CM

## 2016-10-15 DIAGNOSIS — Z1231 Encounter for screening mammogram for malignant neoplasm of breast: Secondary | ICD-10-CM

## 2016-10-15 DIAGNOSIS — R3989 Other symptoms and signs involving the genitourinary system: Secondary | ICD-10-CM | POA: Diagnosis not present

## 2016-10-15 DIAGNOSIS — E669 Obesity, unspecified: Secondary | ICD-10-CM

## 2016-10-15 LAB — POCT URINALYSIS DIPSTICK
BILIRUBIN UA: NEGATIVE
Blood, UA: POSITIVE
Glucose, UA: NEGATIVE
KETONES UA: NEGATIVE
Nitrite, UA: POSITIVE
PH UA: 6.5 (ref 5.0–8.0)
Protein, UA: NEGATIVE
Spec Grav, UA: 1.015 (ref 1.030–1.035)
Urobilinogen, UA: NEGATIVE (ref ?–2.0)

## 2016-10-15 MED ORDER — CIPROFLOXACIN HCL 250 MG PO TABS: 250.0000 mg | ORAL_TABLET | Freq: Two times a day (BID) | ORAL | 0 refills | Status: DC

## 2016-10-15 MED ORDER — SYNTHROID 88 MCG PO TABS: ORAL_TABLET | ORAL | 0 refills | Status: DC

## 2016-10-15 NOTE — Progress Notes (Signed)
Name: Claudia Norris   MRN: [id number]    DOB: Oct 14, 1955   Date:10/15/2016       Progress Note  Subjective  Chief Complaint  Chief Complaint  Patient presents with  . Hypothyroidism  . Medication Refill  . Urinary Tract Infection    odor and discolored urine, has frequent bladder infections    HPI  Dyslipidemia: on life style modification only, her last labs was excellent, but she has gained weight since last visit,  no chest pain. She will resume her exercise   Hypothyroidism: taking Synthroid 88 mcg daily and half on Sunday's . She denies palpitation,  weight loss or constipation.   GERD: she has occasional regurgitation and heartburn, she takes Tums prn. Usually triggered by her diet.  Obesity: she has gained 8 lbs since last visit, not as active this time of the year. She is trying to change her diet.   Urine odor and intermittent frequency: she has a history of frequent UTI, she has noticed mild dysuria, and mild right lower back pain, no fever or chills.   OSA: had two studies in the past, unable to have titration part because poor seal or inability to tolerate nose prong, she states she has been feeling more tired lately, but does not wake up with headaches on a regular basis.    Patient Active Problem List   Diagnosis Date Noted  . Personal history of colonic polyps   . Urinary tract infection 03/07/2016  . Family history of rectal cancer 02/29/2016  . Dyslipidemia 03/03/2015  . Gastro-esophageal reflux disease without esophagitis 03/03/2015  . Hepatomegaly 03/03/2015  . History of cervical cancer 03/03/2015  . H/O: HTN (hypertension) 03/03/2015  . H/O: hysterectomy 03/03/2015  . Blood glucose elevated 03/03/2015  . Adult hypothyroidism 03/03/2015  . Obesity (BMI 30.0-34.9) 03/03/2015  . Plantar fasciitis 03/03/2015  . Apnea, sleep 03/03/2015  . Closed dislocation of jaw 03/03/2015  . Fibromyalgia syndrome 10/04/2009    Past Surgical History:   Procedure Laterality Date  . ABDOMINAL HYSTERECTOMY    . CESAREAN SECTION    . COLON SURGERY  2009   removed some of colon  . COLONOSCOPY WITH PROPOFOL N/A 04/18/2016   Procedure: COLONOSCOPY WITH PROPOFOL;  Surgeon: Midge Minium, MD;  Location[facility];  Service: Endoscopy;  Laterality: N/A;  sleep apnea    Family History  Problem Relation Age of Onset  . Hypertension Mother   . Hypothyroidism Mother   . Heart disease Mother     bypass  . Stroke Mother   . Cancer - Other Mother   . Arthritis Father     Social History   Social History  . Marital status: Married    Spouse name: N/A  . Number of children: N/A  . Years of education: N/A   Occupational History  . Not on file.   Social History Main Topics  . Smoking status: Never Smoker  . Smokeless tobacco: Never Used  . Alcohol use No  . Drug use: No  . Sexual activity: No   Other Topics Concern  . Not on file   Social History Narrative  . No narrative on file     Current Outpatient Prescriptions:  .  aspirin EC 81 MG tablet, Take 81 mg by mouth daily., Disp: , Rfl:  .  Nutritional Supplements (JUICE PLUS FIBRE PO), Take by mouth., Disp: , Rfl:  .  SYNTHROID 88 MCG tablet, TAKE 1 TABLET (88 MCG TOTAL) BY MOUTH DAILY  BEFORE BREAKFAST., Disp: 90 tablet, Rfl: 0  No Known Allergies   ROS  Constitutional: Negative for fever, positive for  weight change.  Respiratory: Negative for cough and shortness of breath.   Cardiovascular: Negative for chest pain or palpitations.  Gastrointestinal: Negative for abdominal pain, no bowel changes.  Musculoskeletal: Negative for gait problem or joint swelling.  Skin: Negative for rash.  Neurological: Negative for dizziness or headache.  No other specific complaints in a complete review of systems (except as listed in HPI above).  Objective  Vitals:   10/15/16 0911  BP: 122/78  Pulse: 79  Resp: 16  Temp: 97.7 F (36.5 C)  SpO2: 97%  Weight: 205 lb 3 oz  (93.1 kg)  Height: 5\' 5"  (1.651 m)    Body mass index is 34.14 kg/m.  Physical Exam  Constitutional: Patient appears well-developed and well-nourished. Obese  No distress.  HEENT: head atraumatic, normocephalic, pupils equal and reactive to light,  neck supple, throat within normal limits Normal thyroid Cardiovascular: Normal rate, regular rhythm and normal heart sounds.  No murmur heard. No BLE edema. Pulmonary/Chest: Effort normal and breath sounds normal. No respiratory distress. Abdominal: Soft.  There is no tenderness. CVA tenderness negative Psychiatric: Patient has a normal mood and affect. behavior is normal. Judgment and thought content normal.  Recent Results (from the past 2160 hour(s))  TSH     Status: None   Collection Time: 10/14/16  9:18 AM  Result Value Ref Range   TSH 0.46 mIU/L    Comment:   Reference Range   > or = 20 Years  0.40-4.50   Pregnancy Range First trimester  0.26-2.66 Second trimester 0.55-2.73 Third trimester  0.43-2.91     POCT urinalysis dipstick     Status: Abnormal   Collection Time: 10/15/16  9:23 AM  Result Value Ref Range   Color, UA yellow    Clarity, UA cloudy    Glucose, UA neg    Bilirubin, UA neg    Ketones, UA neg    Spec Grav, UA 1.015 1.030 - 1.035   Blood, UA pos    pH, UA 6.5 5.0 - 8.0   Protein, UA neg    Urobilinogen, UA negative Negative - 2.0   Nitrite, UA pos    Leukocytes, UA moderate (2+) (A) Negative      PHQ2/9: Depression screen PHQ 2/9 03/06/2015  Decreased Interest 0  Down, Depressed, Hopeless 0  PHQ - 2 Score 0     Fall Risk: Fall Risk  03/06/2015  Falls in the past year? No     Assessment & Plan  1. Urine discoloration  - POCT urinalysis dipstick - Urine culture - Cipro 250 mg twice daily for 10 days  She had 5 UTI episodes in 2017, discussed going back on preventive medication  2. Abnormal urine odor  - POCT urinalysis dipstick - Urine culture  3. Dyslipidemia  Discussed life  style modification   4. Obstructive sleep apnea syndrome  She wants to try a home sleep study - but she will contact her insurance She had two studies but the mask did not seal and she was not able to tolerate it during study so pressure was not right.   5. Gastro-esophageal reflux disease without esophagitis  Doing well at this time  6. Obesity (BMI 30.0-34.9)  Discussed with the patient the risk posed by an increased BMI. Discussed importance of portion control, calorie counting and at least 150 minutes of physical activity weekly.  Avoid sweet beverages and drink more water. Eat at least 6 servings of fruit and vegetables daily   7. Other specified hypothyroidism  At goal, continue current dose of Synthroid  8. Needs flu shot  - Flu Vaccine QUAD 36+ mos IM  9. Breast cancer screening  - MM Digital Screening; Future

## 2016-10-15 NOTE — Patient Instructions (Signed)
Calcitonin   

## 2016-10-17 LAB — URINE CULTURE

## 2016-12-07 ENCOUNTER — Other Ambulatory Visit: Payer: Self-pay | Admitting: Family Medicine

## 2016-12-07 DIAGNOSIS — E038 Other specified hypothyroidism: Secondary | ICD-10-CM

## 2016-12-12 ENCOUNTER — Other Ambulatory Visit: Payer: Self-pay | Admitting: Family Medicine

## 2016-12-12 DIAGNOSIS — E038 Other specified hypothyroidism: Secondary | ICD-10-CM

## 2016-12-15 ENCOUNTER — Other Ambulatory Visit: Payer: Self-pay | Admitting: Family Medicine

## 2016-12-15 DIAGNOSIS — E038 Other specified hypothyroidism: Secondary | ICD-10-CM

## 2016-12-15 MED ORDER — SYNTHROID 88 MCG PO TABS: 88.0000 ug | ORAL_TABLET | Freq: Every day | ORAL | 0 refills | Status: DC

## 2016-12-15 NOTE — Telephone Encounter (Signed)
PT IS NEEDING REFILL ON SYNTHROID AND SHE ONLY HAS 1 LEFT. PHARM IS [facility] [address]

## 2017-03-03 ENCOUNTER — Encounter: Payer: BLUE CROSS/BLUE SHIELD | Admitting: Family Medicine

## 2017-03-10 ENCOUNTER — Other Ambulatory Visit: Payer: Self-pay | Admitting: Family Medicine

## 2017-03-10 DIAGNOSIS — E038 Other specified hypothyroidism: Secondary | ICD-10-CM

## 2017-03-10 MED ORDER — SYNTHROID 88 MCG PO TABS: 88.0000 ug | ORAL_TABLET | Freq: Every day | ORAL | 0 refills | Status: DC

## 2017-03-10 NOTE — Telephone Encounter (Signed)
Pt verbally informed. °

## 2017-03-10 NOTE — Telephone Encounter (Signed)
Pt is leaving tomorrow going out of town to her parents (due to them being in rehab). She does have upcoming appt for Sept. She is requesting a refill on Synthroid 88 mcg asking [facility] [address]. Would like to pick up today if possible

## 2017-03-13 ENCOUNTER — Other Ambulatory Visit: Payer: Self-pay | Admitting: Family Medicine

## 2017-03-13 DIAGNOSIS — E038 Other specified hypothyroidism: Secondary | ICD-10-CM

## 2017-04-06 ENCOUNTER — Other Ambulatory Visit: Payer: Self-pay | Admitting: Family Medicine

## 2017-04-06 DIAGNOSIS — E038 Other specified hypothyroidism: Secondary | ICD-10-CM

## 2017-04-06 NOTE — Telephone Encounter (Signed)
Patient requesting refill of [facility] to [facility].

## 2017-04-15 ENCOUNTER — Other Ambulatory Visit: Payer: Self-pay | Admitting: Family Medicine

## 2017-04-15 ENCOUNTER — Telehealth: Payer: Self-pay | Admitting: Family Medicine

## 2017-04-15 NOTE — Telephone Encounter (Signed)
Please order labs needed.

## 2017-04-15 NOTE — Telephone Encounter (Signed)
Patient is scheduled to come in to see Dr. Carlynn Purl on 04/17/17 @ 8am for her 6 month follow up for thyroid  and she would like to know if she could come in on Thurs. 04/16/17 to have labs done ahead of time.  Please advise patient.

## 2017-04-16 ENCOUNTER — Other Ambulatory Visit: Payer: Self-pay | Admitting: Family Medicine

## 2017-04-16 DIAGNOSIS — E038 Other specified hypothyroidism: Secondary | ICD-10-CM

## 2017-04-16 DIAGNOSIS — R739 Hyperglycemia, unspecified: Secondary | ICD-10-CM

## 2017-04-16 DIAGNOSIS — Z79899 Other long term (current) drug therapy: Secondary | ICD-10-CM

## 2017-04-16 DIAGNOSIS — E785 Hyperlipidemia, unspecified: Secondary | ICD-10-CM

## 2017-04-16 NOTE — Telephone Encounter (Signed)
Pt came in this morning and did labs

## 2017-04-16 NOTE — Telephone Encounter (Signed)
done

## 2017-04-17 ENCOUNTER — Ambulatory Visit (INDEPENDENT_AMBULATORY_CARE_PROVIDER_SITE_OTHER): Payer: BLUE CROSS/BLUE SHIELD | Admitting: Family Medicine

## 2017-04-17 ENCOUNTER — Encounter: Payer: Self-pay | Admitting: Family Medicine

## 2017-04-17 VITALS — BP 118/68 | HR 88 | Temp 98.4°F | Resp 16 | Ht 65.0 in | Wt 202.3 lb

## 2017-04-17 DIAGNOSIS — M545 Low back pain, unspecified: Secondary | ICD-10-CM

## 2017-04-17 DIAGNOSIS — R739 Hyperglycemia, unspecified: Secondary | ICD-10-CM | POA: Diagnosis not present

## 2017-04-17 DIAGNOSIS — Z79899 Other long term (current) drug therapy: Secondary | ICD-10-CM

## 2017-04-17 DIAGNOSIS — E785 Hyperlipidemia, unspecified: Secondary | ICD-10-CM | POA: Diagnosis not present

## 2017-04-17 DIAGNOSIS — Z23 Encounter for immunization: Secondary | ICD-10-CM

## 2017-04-17 DIAGNOSIS — E038 Other specified hypothyroidism: Secondary | ICD-10-CM

## 2017-04-17 DIAGNOSIS — R5383 Other fatigue: Secondary | ICD-10-CM

## 2017-04-17 LAB — TSH: TSH: 0.35 mIU/L — ABNORMAL LOW (ref 0.40–4.50)

## 2017-04-17 MED ORDER — SYNTHROID 88 MCG PO TABS: 88.0000 ug | ORAL_TABLET | Freq: Every day | ORAL | 0 refills | Status: DC

## 2017-04-17 MED ORDER — CYCLOBENZAPRINE HCL 5 MG PO TABS: 5.0000 mg | ORAL_TABLET | Freq: Every day | ORAL | 1 refills | Status: DC

## 2017-04-17 NOTE — Progress Notes (Signed)
Name: Claudia Norris   MRN: [id number]    DOB: 1956/03/28   Date:04/17/2017       Progress Note  Subjective  Chief Complaint  Chief Complaint  Patient presents with  . Obesity    6 month follow up  . Gastroesophageal Reflux  . Hypothyroidism  . Sleep Apnea  . Back Pain    lower back right side mostly mostly when bending or sitting for long periods of time    . Flu Vaccine    HPI  Dyslipidemia: on life style modification only, her last labs was excellent, she will have repeat labs today, weight is stable,  no chest pain. She will resume her exercise   Hypothyroidism: taking Synthroid 88 mcg daily and half on Sunday's, TSH is suppressed, advised to skip Sundays . She denies palpitation, or constipation.   GERD: she has occasional regurgitation and heartburn, she states it is getting a little worse lately because she loves onions and tomatoes, taking Tums prn   Obesity: she has lost 3 pounds since last visit. She has changed her diet and has been walking more  OSA: had two studies in the past, unable to have titration part because poor seal or inability to tolerate nose prong, she states she has been feeling more tired lately, but does not wake up with headaches.  Low back pain: she continues to take care of her parents that live in [place], and over the past 4 months she noticed pain on lumbar spine, it is mild and constant most of the time, but when she gets up from sitting for a long time she has to lean forward for a while before she can stand up straight. Pain is as high as a 5/10, occasionally goes down right leg. She has been taking Ibuprofen and advised to switch to Tylenol instead  Patient Active Problem List   Diagnosis Date Noted  . Personal history of colonic polyps   . Urinary tract infection 03/07/2016  . Family history of rectal cancer 02/29/2016  . Dyslipidemia 03/03/2015  . Gastro-esophageal reflux disease without esophagitis 03/03/2015  . History of cervical  cancer 03/03/2015  . H/O: HTN (hypertension) 03/03/2015  . H/O: hysterectomy 03/03/2015  . Blood glucose elevated 03/03/2015  . Adult hypothyroidism 03/03/2015  . Obesity (BMI 30.0-34.9) 03/03/2015  . Plantar fasciitis 03/03/2015  . Apnea, sleep 03/03/2015  . Closed dislocation of jaw 03/03/2015  . Fibromyalgia syndrome 10/04/2009    Past Surgical History:  Procedure Laterality Date  . ABDOMINAL HYSTERECTOMY    . CESAREAN SECTION    . COLON SURGERY  2009   removed some of colon  . COLONOSCOPY WITH PROPOFOL N/A 04/18/2016   Procedure: COLONOSCOPY WITH PROPOFOL;  Surgeon: Midge Minium, MD;  Location: [facility];  Service: Endoscopy;  Laterality: N/A;  sleep apnea    Family History  Problem Relation Age of Onset  . Hypertension Mother   . Hypothyroidism Mother   . Heart disease Mother        bypass  . Stroke Mother   . Cancer - Other Mother   . Arthritis Father     Social History   Social History  . Marital status: Married    Spouse name: N/A  . Number of children: N/A  . Years of education: N/A   Occupational History  . Not on file.   Social History Main Topics  . Smoking status: Never Smoker  . Smokeless tobacco: Never Used  . Alcohol use No  .  Drug use: No  . Sexual activity: No   Other Topics Concern  . Not on file   Social History Narrative  . No narrative on file     Current Outpatient Prescriptions:  .  aspirin EC 81 MG tablet, Take 81 mg by mouth daily., Disp: , Rfl:  .  Nutritional Supplements (JUICE PLUS FIBRE PO), Take by mouth., Disp: , Rfl:  .  SYNTHROID 88 MCG tablet, TAKE 1 TABLET (88 MCG TOTAL) BY MOUTH DAILY BEFORE BREAKFAST. AND HALF ON SUNDAYS, Disp: 90 tablet, Rfl: 0  No Known Allergies   ROS  Constitutional: Negative for fever or weight change.  Respiratory: Negative for cough and shortness of breath.   Cardiovascular: Negative for chest pain or palpitations.  Gastrointestinal: Negative for abdominal pain, no bowel  changes.  Musculoskeletal: Negative for gait problem or joint swelling.  Skin: Negative for rash.  Neurological: Negative for dizziness or headache.  No other specific complaints in a complete review of systems (except as listed in HPI above).  Objective  Vitals:   04/17/17 0807  BP: 118/68  Pulse: 88  Resp: 16  Temp: 98.4 F (36.9 C)  SpO2: 94%  Weight: 202 lb 5 oz (91.8 kg)  Height:  (1.651 m)    Body mass index is 33.67 kg/m.  Physical Exam  Constitutional: Patient appears well-developed and well-nourished. Obese  No distress.  HEENT: head atraumatic, normocephalic, pupils equal and reactive to light, neck supple, throat within normal limits Cardiovascular: Normal rate, regular rhythm and normal heart sounds.  No murmur heard. No BLE edema. Pulmonary/Chest: Effort normal and breath sounds normal. No respiratory distress. Abdominal: Soft.  There is no tenderness. Psychiatric: Patient has a normal mood and affect. behavior is normal. Judgment and thought content normal. Muscular Skeletal: mild pain on right lower back and over sciatic notch, negative straight leg raise, normal rom of spine  Recent Results (from the past 2160 hour(s))  TSH     Status: Abnormal   Collection Time: 04/16/17 10:49 AM  Result Value Ref Range   TSH 0.35 (L) 0.40 - 4.50 mIU/L    PHQ2/9: Depression screen [facility] 2/9 04/17/2017 03/06/2015  Decreased Interest 0 0  Down, Depressed, Hopeless 0 0  PHQ - 2 Score 0 0     Fall Risk: Fall Risk  04/17/2017 03/06/2015  Falls in the past year? No No    Assessment & Plan   1. Other specified hypothyroidism  - TSH - SYNTHROID 88 MCG tablet; Take 1 tablet (88 mcg total) by mouth daily before breakfast. Skips Sundays  Dispense: 90 tablet; Refill: 0  2. Need for influenza vaccination  - Flu Vaccine QUAD 6+ mos PF IM (Fluarix Quad PF)  3. Long-term use of high-risk medication  - COMPLETE METABOLIC PANEL WITH GFR - CBC with  Differential/Platelet  4. Blood glucose elevated  - Hemoglobin A1c - Insulin, fasting  5. Dyslipidemia  - Lipid panel  6. Intermittent low back pain  - cyclobenzaprine (FLEXERIL) 5 MG tablet; Take 1 tablet (5 mg total) by mouth at bedtime.  Dispense: 30 tablet; Refill: 1  7. Other fatigue  - Vitamin B12 - VITAMIN D 25 Hydroxy (Vit-D Deficiency, Fractures)

## 2017-04-17 NOTE — Patient Instructions (Signed)

## 2017-04-19 ENCOUNTER — Other Ambulatory Visit: Payer: Self-pay | Admitting: Family Medicine

## 2017-04-19 MED ORDER — VITAMIN D (ERGOCALCIFEROL) 1.25 MG (50000 UNIT) PO CAPS: 50000.0000 [IU] | ORAL_CAPSULE | ORAL | 0 refills | Status: DC

## 2017-04-20 ENCOUNTER — Telehealth: Payer: Self-pay

## 2017-04-20 LAB — CBC WITH DIFFERENTIAL/PLATELET
BASOS ABS: 50 {cells}/uL (ref 0–200)
BASOS PCT: 0.9 %
EOS ABS: 110 {cells}/uL (ref 15–500)
Eosinophils Relative: 2 %
HCT: 38.5 % (ref 35.0–45.0)
HEMOGLOBIN: 13.2 g/dL (ref 11.7–15.5)
Lymphs Abs: 1727 cells/uL (ref 850–3900)
MCH: 29.9 pg (ref 27.0–33.0)
MCHC: 34.3 g/dL (ref 32.0–36.0)
MCV: 87.3 fL (ref 80.0–100.0)
MPV: 10.5 fL (ref 7.5–12.5)
Monocytes Relative: 8.6 %
NEUTROS ABS: 3141 {cells}/uL (ref 1500–7800)
Neutrophils Relative %: 57.1 %
Platelets: 256 10*3/uL (ref 140–400)
RBC: 4.41 10*6/uL (ref 3.80–5.10)
RDW: 12.9 % (ref 11.0–15.0)
TOTAL LYMPHOCYTE: 31.4 %
WBC: 5.5 10*3/uL (ref 3.8–10.8)
WBCMIX: 473 {cells}/uL (ref 200–950)

## 2017-04-20 LAB — HEMOGLOBIN A1C
EAG (MMOL/L): 5.5 (calc)
HEMOGLOBIN A1C: 5.1 %{Hb} (ref <5.7)
MEAN PLASMA GLUCOSE: 100 (calc)

## 2017-04-20 LAB — LIPID PANEL
CHOL/HDL RATIO: 3.6 (calc) (ref <5.0)
CHOLESTEROL: 186 mg/dL (ref <200)
HDL: 51 mg/dL (ref >50)
LDL CHOLESTEROL (CALC): 112 mg/dL — AB
Non-HDL Cholesterol (Calc): 135 mg/dL (calc) — ABNORMAL HIGH (ref <130)
Triglycerides: 121 mg/dL (ref <150)

## 2017-04-20 LAB — COMPLETE METABOLIC PANEL WITH GFR
AG RATIO: 1.7 (calc) (ref 1.0–2.5)
ALT: 13 U/L (ref 6–29)
AST: 17 U/L (ref 10–35)
Albumin: 4 g/dL (ref 3.6–5.1)
Alkaline phosphatase (APISO): 68 U/L (ref 33–130)
BUN: 17 mg/dL (ref 7–25)
CO2: 25 mmol/L (ref 20–32)
CREATININE: 0.71 mg/dL (ref 0.50–0.99)
Calcium: 8.8 mg/dL (ref 8.6–10.4)
Chloride: 107 mmol/L (ref 98–110)
GFR, EST AFRICAN AMERICAN: 107 mL/min/{1.73_m2} (ref >=60)
GFR, EST NON AFRICAN AMERICAN: 92 mL/min/{1.73_m2} (ref >=60)
GLOBULIN: 2.4 g/dL (ref 1.9–3.7)
Glucose, Bld: 101 mg/dL — ABNORMAL HIGH (ref 65–99)
POTASSIUM: 4.3 mmol/L (ref 3.5–5.3)
SODIUM: 139 mmol/L (ref 135–146)
Total Bilirubin: 0.5 mg/dL (ref 0.2–1.2)
Total Protein: 6.4 g/dL (ref 6.1–8.1)

## 2017-04-20 LAB — INSULIN, RANDOM: Insulin: 14.5 u[IU]/mL (ref 2.0–19.6)

## 2017-04-20 LAB — VITAMIN B12: VITAMIN B 12: 179 pg/mL — AB (ref 200–1100)

## 2017-04-20 LAB — VITAMIN D 25 HYDROXY (VIT D DEFICIENCY, FRACTURES): Vit D, 25-Hydroxy: 21 ng/mL — ABNORMAL LOW (ref 30–100)

## 2017-04-20 NOTE — Telephone Encounter (Signed)
Left message for patient to call back regarding labs. 

## 2017-04-20 NOTE — Telephone Encounter (Signed)
-----   Message from Alba Cory, MD sent at 04/19/2017  3:12 PM EDT ----- Vitamin D is low, I will send prescription vitamin D to  pharmacy and once finished she/he needs to take otc vitamin D 2000 units daily Normal hgbA1C Fasting glucose slightly elevated, normal kidney and liver function test Lipid panel is at goal for her B12 is low, needs to either take otc B12 1000 mg SL daily or come in for B12 shots monthly Normal CBC

## 2017-04-21 ENCOUNTER — Encounter: Payer: Self-pay | Admitting: Family Medicine

## 2017-04-21 ENCOUNTER — Ambulatory Visit (INDEPENDENT_AMBULATORY_CARE_PROVIDER_SITE_OTHER): Payer: BLUE CROSS/BLUE SHIELD

## 2017-04-21 DIAGNOSIS — R5383 Other fatigue: Secondary | ICD-10-CM

## 2017-04-21 MED ORDER — CYANOCOBALAMIN 1000 MCG/ML IJ SOLN
1000.0000 ug | INTRAMUSCULAR | Status: AC
  Administered 2017-04-21 – 2017-05-21 (×2): 1000 ug via INTRAMUSCULAR

## 2017-04-21 NOTE — Progress Notes (Signed)
Please check and sign order for Standing B-12 injections.

## 2017-05-21 ENCOUNTER — Ambulatory Visit (INDEPENDENT_AMBULATORY_CARE_PROVIDER_SITE_OTHER): Payer: BLUE CROSS/BLUE SHIELD

## 2017-05-21 DIAGNOSIS — R5383 Other fatigue: Secondary | ICD-10-CM

## 2017-05-21 NOTE — Progress Notes (Signed)
Patient was seen for b-12 injection. She tolerated it well.

## 2017-06-29 ENCOUNTER — Ambulatory Visit (INDEPENDENT_AMBULATORY_CARE_PROVIDER_SITE_OTHER): Payer: BLUE CROSS/BLUE SHIELD

## 2017-06-29 DIAGNOSIS — D518 Other vitamin B12 deficiency anemias: Secondary | ICD-10-CM | POA: Diagnosis not present

## 2017-06-29 MED ORDER — CYANOCOBALAMIN 1000 MCG/ML IJ SOLN
1000.0000 ug | Freq: Once | INTRAMUSCULAR | Status: AC
  Administered 2017-06-29: 1000 ug via INTRAMUSCULAR

## 2017-07-11 ENCOUNTER — Other Ambulatory Visit: Payer: Self-pay | Admitting: Family Medicine

## 2017-07-11 NOTE — Telephone Encounter (Signed)
Refill request for general medication: Vitamin D   Last office visit: 04/21/2017  Last physical exam: 02/29/2016  Follow up visit: 07/14/2017

## 2017-07-14 ENCOUNTER — Encounter: Payer: Self-pay | Admitting: Family Medicine

## 2017-07-14 ENCOUNTER — Ambulatory Visit (INDEPENDENT_AMBULATORY_CARE_PROVIDER_SITE_OTHER): Payer: BLUE CROSS/BLUE SHIELD | Admitting: Family Medicine

## 2017-07-14 VITALS — BP 130/80 | HR 86 | Temp 98.6°F | Resp 14 | Ht 64.17 in | Wt 201.8 lb

## 2017-07-14 DIAGNOSIS — R9431 Abnormal electrocardiogram [ECG] [EKG]: Secondary | ICD-10-CM

## 2017-07-14 DIAGNOSIS — Z23 Encounter for immunization: Secondary | ICD-10-CM

## 2017-07-14 DIAGNOSIS — E2839 Other primary ovarian failure: Secondary | ICD-10-CM

## 2017-07-14 DIAGNOSIS — Z01419 Encounter for gynecological examination (general) (routine) without abnormal findings: Secondary | ICD-10-CM

## 2017-07-14 DIAGNOSIS — E038 Other specified hypothyroidism: Secondary | ICD-10-CM

## 2017-07-14 DIAGNOSIS — H6991 Unspecified Eustachian tube disorder, right ear: Secondary | ICD-10-CM

## 2017-07-14 DIAGNOSIS — H6981 Other specified disorders of Eustachian tube, right ear: Secondary | ICD-10-CM | POA: Diagnosis not present

## 2017-07-14 DIAGNOSIS — R42 Dizziness and giddiness: Secondary | ICD-10-CM | POA: Diagnosis not present

## 2017-07-14 DIAGNOSIS — Z8262 Family history of osteoporosis: Secondary | ICD-10-CM

## 2017-07-14 DIAGNOSIS — H6123 Impacted cerumen, bilateral: Secondary | ICD-10-CM

## 2017-07-14 DIAGNOSIS — Z1239 Encounter for other screening for malignant neoplasm of breast: Secondary | ICD-10-CM

## 2017-07-14 DIAGNOSIS — Z1231 Encounter for screening mammogram for malignant neoplasm of breast: Secondary | ICD-10-CM | POA: Diagnosis not present

## 2017-07-14 LAB — TSH: TSH: 1.12 mIU/L (ref 0.40–4.50)

## 2017-07-14 MED ORDER — FLUTICASONE PROPIONATE 50 MCG/ACT NA SUSP: 2.0000 | Freq: Every day | NASAL | 2 refills | Status: DC

## 2017-07-14 NOTE — Progress Notes (Signed)
Name: Claudia Norris   MRN: [id number]    DOB: 06/01/1956   Date:07/14/2017       Progress Note  Subjective  Chief Complaint  Chief Complaint  Patient presents with  . Annual Exam  . Dizziness    HPI   Patient presents for annual CPE and follow up.  Hypothyroidism; last TSH was suppressed, she did not return for repeat labs, we will check it today, weight is stable, no palpitation.   Ear fullness: she has noticed nasal congestion, ear fullness, and noticed some vertigo when she moves her head. No hearing loss, but has intermittent left ear tinnitus, but not on the right. Symptoms started about month ago, episodes are gradually improving. She states that episodes every other day now, sometimes just light headed not spinning all the time.   Diet: discussed high calcium intake on diet and also vitamin D supplementation with natural sources Exercise: needs to increase physical activity   USPSTF grade A and B recommendations  Depression:  [facility] 07/14/2017 04/17/2017 03/06/2015  Decreased Interest 0 0 0  Down, Depressed, Hopeless 0 0 0  PHQ - 2 Score 0 0 0   Hypertension: BP Readings from Last 3 Encounters:  07/14/17 130/80  04/17/17 118/68  10/15/16 122/78   Obesity: Wt Readings from Last 3 Encounters:  07/14/17 201 lb 12.8 oz (91.5 kg)  04/17/17 202 lb 5 oz (91.8 kg)  10/15/16 205 lb 3 oz (93.1 kg)   BMI Readings from Last 3 Encounters:  07/14/17 34.45 kg/m  04/17/17 33.67 kg/m  10/15/16 34.14 kg/m     Hep C: normal 05/2014 STD testing and prevention (chl/gon/syphilis): not interested  Intimate partner violence:negative screen  Sexual History/Pain during Intercourse: has pain during intercourse, not very sexually active, discussed medication  Menstrual History/LMP/Abnormal Bleeding:s/p hysterectomy  Incontinence Symptoms:    Advanced Care Planning: A voluntary discussion about advance care planning including the explanation and discussion  of advance directives.  Discussed health care proxy and Living will, and the patient was able to identify a health care proxy as husband.  Patient does not have a living will at present time. If patient does have living will, I have requested they bring this to the clinic to be scanned in to their chart.  Breast cancer: needs to schedule mammogram  Cervical cancer screening: not indicated   Osteoporosis:  Family history of osteoporosis, advised bone density   Lipids:  Lab Results  Component Value Date   CHOL 186 04/17/2017   CHOL 186 02/29/2016   CHOL 208 (H) 02/28/2015   Lab Results  Component Value Date   HDL 51 04/17/2017   HDL 55 02/29/2016   HDL 48 02/28/2015   Lab Results  Component Value Date   LDLCALC 109 02/29/2016   LDLCALC 130 (H) 02/28/2015   Lab Results  Component Value Date   TRIG 121 04/17/2017   TRIG 111 02/29/2016   TRIG 149 02/28/2015   Lab Results  Component Value Date   CHOLHDL 3.6 04/17/2017   CHOLHDL 3.4 02/29/2016   CHOLHDL 4.3 02/28/2015   No results found for: LDLDIRECT  Glucose:  Glucose  Date Value Ref Range Status  02/28/2015 97 65 - 99 mg/dL Final   Glucose, Bld  Date Value Ref Range Status  04/17/2017 101 (H) 65 - 99 mg/dL Final    Comment:    .            Fasting reference interval . For someone without  known diabetes, a glucose value between 100 and 125 mg/dL is consistent with prediabetes and should be confirmed with a follow-up test. .   02/29/2016 99 65 - 99 mg/dL Final     Colorectal cancer: 03/2016 Lung cancer:   Low Dose CT Chest recommended if Age 62-80 years, 30 pack-year currently smoking OR have quit w/in 15years. Patient does not qualify.   Aspirin: discussed USPTF ZOX:WRUEA    Patient Active Problem List   Diagnosis Date Noted  . Personal history of colonic polyps   . Urinary tract infection 03/07/2016  . Family history of rectal cancer 02/29/2016  . Dyslipidemia 03/03/2015  . Gastro-esophageal  reflux disease without esophagitis 03/03/2015  . History of cervical cancer 03/03/2015  . H/O: HTN (hypertension) 03/03/2015  . H/O: hysterectomy 03/03/2015  . Blood glucose elevated 03/03/2015  . Adult hypothyroidism 03/03/2015  . Obesity (BMI 30.0-34.9) 03/03/2015  . Plantar fasciitis 03/03/2015  . Apnea, sleep 03/03/2015  . Closed dislocation of jaw 03/03/2015  . Fibromyalgia syndrome 10/04/2009    Past Surgical History:  Procedure Laterality Date  . ABDOMINAL HYSTERECTOMY    . CESAREAN SECTION    . COLON SURGERY  2009   removed some of colon  . COLONOSCOPY WITH PROPOFOL N/A 04/18/2016   Procedure: COLONOSCOPY WITH PROPOFOL;  Surgeon: Midge Minium, MD;  Location: [facility];  Service: Endoscopy;  Laterality: N/A;  sleep apnea    Family History  Problem Relation Age of Onset  . Hypertension Mother   . Hypothyroidism Mother   . Heart disease Mother        bypass  . Stroke Mother   . Cancer - Other Mother   . Arthritis Father   . Dementia Father     Social History   Socioeconomic History  . Marital status: Married    Spouse name: [name]  . Number of children: 2  . Years of education: Not on file  . Highest education level: Some college, no degree  Social Needs  . Financial resource strain: Somewhat hard  . Food insecurity - worry: Never true  . Food insecurity - inability: Never true  . Transportation needs - medical: No  . Transportation needs - non-medical: No  Occupational History  . Not on file  Tobacco Use  . Smoking status: Never Smoker  . Smokeless tobacco: Never Used  Substance and Sexual Activity  . Alcohol use: No    Alcohol/week: 0.0 oz  . Drug use: No  . Sexual activity: Yes    Birth control/protection: Post-menopausal  Other Topics Concern  . Not on file  Social History Narrative   Married. Has two grown children.    One finished law school the youngest lives with grandmother in [place] - helping her out.   She takes care  of her mother and father that live in [place]     Current Outpatient Medications:  .  aspirin EC 81 MG tablet, Take 81 mg by mouth daily., Disp: , Rfl:  .  Nutritional Supplements (JUICE PLUS FIBRE PO), Take by mouth., Disp: , Rfl:  .  SYNTHROID 88 MCG tablet, Take 1 tablet (88 mcg total) by mouth daily before breakfast. Skips Sundays, Disp: 90 tablet, Rfl: 0 .  Vitamin D, Ergocalciferol, (DRISDOL) 50000 units CAPS capsule, TAKE 1 CAPSULE (50,000 UNITS TOTAL) BY MOUTH EVERY 7 (SEVEN) DAYS., Disp: 12 capsule, Rfl: 2  Current Facility-Administered Medications:  .  cyanocobalamin ((VITAMIN B-12)) injection 1,000 mcg, 1,000 mcg, Intramuscular, Q30 days, Balen Woolum,  Danna Hefty, MD, 1,000 mcg at 05/21/17 1340  No Known Allergies   ROS   Constitutional: Negative for fever or weight change.  Respiratory: Negative for cough and shortness of breath.   Cardiovascular: Negative for chest pain or palpitations.  Gastrointestinal: Negative for abdominal pain, no bowel changes.  Musculoskeletal: Negative for gait problem or joint swelling.  Skin: Negative for rash.  Neurological: positive  for dizziness but no headache.  No other specific complaints in a complete review of systems (except as listed in HPI above).   Objective  Vitals:   07/14/17 0839  BP: 130/80  Pulse: 86  Resp: 14  Temp: 98.6 F (37 C)  TempSrc: Oral  SpO2: 98%  Weight: 201 lb 12.8 oz (91.5 kg)  Height: 5' 4.17" (1.63 m)    Body mass index is 34.45 kg/m.  Physical Exam  Constitutional: Patient appears well-developed and obese. No distress.  HENT: Head: Normocephalic and atraumatic. Ears: B TMs not seen, wax bilaterally  Nose: Nose normal. Mouth/Throat: Oropharynx is clear and moist. No oropharyngeal exudate.  Eyes: Conjunctivae and EOM are normal. Pupils are equal, round, and reactive to light. No scleral icterus.  Neck: Normal range of motion. Neck supple. No JVD present. No thyromegaly present.  Cardiovascular: Normal  rate, regular rhythm and normal heart sounds.  No murmur heard. No BLE edema. Pulmonary/Chest: Effort normal and breath sounds normal. No respiratory distress. Abdominal: Soft. Bowel sounds are normal, no distension. There is no tenderness. no masses Breast: no lumps or masses, no nipple discharge or rashes FEMALE GENITALIA:  Not tone RECTAL: not done Musculoskeletal: Normal range of motion, no joint effusions. No gross deformities Neurological: he is alert and oriented to person, place, and time. No cranial nerve deficit. Coordination, balance, strength, speech and gait are normal.  Skin: Skin is warm and dry. No rash noted. No erythema.  Psychiatric: Patient has a normal mood and affect. behavior is normal. Judgment and thought content normal.   Recent Results (from the past 2160 hour(s))  TSH     Status: Abnormal   Collection Time: 04/16/17 10:49 AM  Result Value Ref Range   TSH 0.35 (L) 0.40 - 4.50 mIU/L  COMPLETE METABOLIC PANEL WITH GFR     Status: Abnormal   Collection Time: 04/17/17  8:31 AM  Result Value Ref Range   Glucose, Bld 101 (H) 65 - 99 mg/dL    Comment: .            Fasting reference interval . For someone without known diabetes, a glucose value between 100 and 125 mg/dL is consistent with prediabetes and should be confirmed with a follow-up test. .    BUN 17 7 - 25 mg/dL   Creat 1.61 0.96 - 0.45 mg/dL    Comment: For patients >69 years of age, the reference limit for Creatinine is approximately 13% higher for people identified as African-American. .    GFR, Est Non African American 92 > OR = 60 mL/min/1.12m2   GFR, Est African American 107 > OR = 60 mL/min/1.51m2   BUN/Creatinine Ratio NOT APPLICABLE 6 - 22 (calc)   Sodium 139 135 - 146 mmol/L   Potassium 4.3 3.5 - 5.3 mmol/L   Chloride 107 98 - 110 mmol/L   CO2 25 20 - 32 mmol/L   Calcium 8.8 8.6 - 10.4 mg/dL   Total Protein 6.4 6.1 - 8.1 g/dL   Albumin 4.0 3.6 - 5.1 g/dL   Globulin 2.4 1.9 - 3.7  g/dL (calc)  AG Ratio 1.7 1.0 - 2.5 (calc)   Total Bilirubin 0.5 0.2 - 1.2 mg/dL   Alkaline phosphatase (APISO) 68 33 - 130 U/L   AST 17 10 - 35 U/L   ALT 13 6 - 29 U/L  CBC with Differential/Platelet     Status: None   Collection Time: 04/17/17  8:31 AM  Result Value Ref Range   WBC 5.5 3.8 - 10.8 Thousand/uL   RBC 4.41 3.80 - 5.10 Million/uL   Hemoglobin 13.2 11.7 - 15.5 g/dL   HCT 95.2 84.1 - 32.4 %   MCV 87.3 80.0 - 100.0 fL   MCH 29.9 27.0 - 33.0 pg   MCHC 34.3 32.0 - 36.0 g/dL   RDW 40.1 02.7 - 25.3 %   Platelets 256 140 - 400 Thousand/uL   MPV 10.5 7.5 - 12.5 fL   Neutro Abs 3,141 1,500 - 7,800 cells/uL   Lymphs Abs 1,727 850 - 3,900 cells/uL   WBC mixed population 473 200 - 950 cells/uL   Eosinophils Absolute 110 15 - 500 cells/uL   Basophils Absolute 50 0 - 200 cells/uL   Neutrophils Relative % 57.1 %   Total Lymphocyte 31.4 %   Monocytes Relative 8.6 %   Eosinophils Relative 2.0 %   Basophils Relative 0.9 %  Hemoglobin A1c     Status: None   Collection Time: 04/17/17  8:31 AM  Result Value Ref Range   Hgb A1c MFr Bld 5.1 <5.7 % of total Hgb    Comment: For the purpose of screening for the presence of diabetes: . <5.7%       Consistent with the absence of diabetes 5.7-6.4%    Consistent with increased risk for diabetes             (prediabetes) > or =6.5%  Consistent with diabetes . This assay result is consistent with a decreased risk of diabetes. . Currently, no consensus exists regarding use of hemoglobin A1c for diagnosis of diabetes in children. . According to American Diabetes Association (ADA) guidelines, hemoglobin A1c <7.0% represents optimal control in non-pregnant diabetic patients. Different metrics may apply to specific patient populations.  Standards of Medical Care in Diabetes(ADA). .    Mean Plasma Glucose 100 (calc)   eAG (mmol/L) 5.5 (calc)  Lipid panel     Status: Abnormal   Collection Time: 04/17/17  8:31 AM  Result Value Ref  Range   Cholesterol 186 <200 mg/dL   HDL 51 >66 mg/dL   Triglycerides 440 <347 mg/dL   LDL Cholesterol (Calc) 112 (H) mg/dL (calc)    Comment: Reference range: <100 . Desirable range <100 mg/dL for primary prevention;   <70 mg/dL for patients with CHD or diabetic patients  with > or = 2 CHD risk factors. Marland Kitchen LDL-C is now calculated using the Martin-Hopkins  calculation, which is a validated novel method providing  better accuracy than the Friedewald equation in the  estimation of LDL-C.  Horald Pollen et al. Lenox Ahr. 4259;563(87): 2061-2068  (http://education.[facility].com/faq/FAQ164)    Total CHOL/HDL Ratio 3.6 <5.0 (calc)   Non-HDL Cholesterol (Calc) 135 (H) <130 mg/dL (calc)    Comment: For patients with diabetes plus 1 major ASCVD risk  factor, treating to a non-HDL-C goal of <100 mg/dL  (LDL-C of <56 mg/dL) is considered a therapeutic  option.   Vitamin B12     Status: Abnormal   Collection Time: 04/17/17  8:31 AM  Result Value Ref Range   Vitamin B-12 179 (L) 200 - 1,100  pg/mL  VITAMIN D 25 Hydroxy (Vit-D Deficiency, Fractures)     Status: Abnormal   Collection Time: 04/17/17  8:31 AM  Result Value Ref Range   Vit D, 25-Hydroxy 21 (L) 30 - 100 ng/mL    Comment: Vitamin D Status         25-OH Vitamin D: . Deficiency:                    <20 ng/mL Insufficiency:             20 - 29 ng/mL Optimal:                 > or = 30 ng/mL . For 25-OH Vitamin D testing on patients on  D2-supplementation and patients for whom quantitation  of D2 and D3 fractions is required, the [facility]ureD(TM) 25-OH VIT D, (D2,D3), LC/MS/MS is recommended: order  code [id number] (patients >4173yrs). . For more information on this test, go to: http://education.[facility].com/faq/FAQ163 (This link is being provided for  informational/educational purposes only.)   Insulin, random     Status: None   Collection Time: 04/17/17  8:31 AM  Result Value Ref Range   Insulin 14.5 2.0 - 19.6 uIU/mL     Comment: This insulin assay shows strong cross-reactivity for some insulin analogs (lispro, aspart, and glargine) and much lower cross-reactivity with others (detemir, glulisine).       PHQ2/9: [facility] 07/14/2017 04/17/2017 03/06/2015  Decreased Interest 0 0 0  Down, Depressed, Hopeless 0 0 0  PHQ - 2 Score 0 0 0    Fall Risk: Fall Risk  07/14/2017 04/17/2017 03/06/2015  Falls in the past year? Yes No No  Number falls in past yr: 1 - -  Injury with Fall? Yes - -     Functional Status Survey: Is the patient deaf or have difficulty hearing?: No Does the patient have difficulty seeing, even when wearing glasses/contacts?: No Does the patient have difficulty concentrating, remembering, or making decisions?: No Does the patient have difficulty walking or climbing stairs?: No Does the patient have difficulty dressing or bathing?: No Does the patient have difficulty doing errands alone such as visiting a doctor's office or shopping?: No  Assessment & Plan  1. Well woman exam  Discussed importance of 150 minutes of physical activity weekly, eat two servings of fish weekly, eat one serving of tree nuts ( cashews, pistachios, pecans, almonds.Marland Kitchen.) every other day, eat 6 servings of fruit/vegetables daily and drink plenty of water and avoid sweet beverages.  -EKG  2. Breast cancer screening  - MM Digital Screening; Future  3. Other specified hypothyroidism  - TSH  4. Vertigo  - Ambulatory referral to ENT  5. Ovarian failure  - DG Bone Density; Future  6. Family history of senile osteoporosis  - DG Bone Density; Future  7. Need for Tdap vaccination  - Tdap vaccine greater than or equal to 7yo IM  8. Dysfunction of right eustachian tube  - fluticasone (FLONASE) 50 MCG/ACT nasal spray; Place 2 sprays into both nostrils daily.  Dispense: 16 g; Refill: 2 - Ambulatory referral to ENT  9. Bilateral impacted cerumen  - Ambulatory referral to ENT  10.  Abnormal EKG  - Ambulatory referral to Cardiology

## 2017-07-15 ENCOUNTER — Telehealth: Payer: Self-pay

## 2017-07-15 NOTE — Telephone Encounter (Signed)
Called patient to inform her that her TSH was normal. If patient calls back please inform her that TSH was normal.

## 2017-07-15 NOTE — Telephone Encounter (Signed)
Copied from CRM [id number]. Topic: General - Other >> Jul 15, 2017  8:50 AM Lelon FrohlichGolden, Tyrell Brereton, RMA wrote: Reason for CRM:pt called and stated that she had missed a call from the office and there is not a message in her chart as to what the call was for Please call pt back

## 2017-07-30 ENCOUNTER — Ambulatory Visit: Payer: BLUE CROSS/BLUE SHIELD

## 2017-08-25 ENCOUNTER — Ambulatory Visit (INDEPENDENT_AMBULATORY_CARE_PROVIDER_SITE_OTHER): Payer: BLUE CROSS/BLUE SHIELD

## 2017-08-25 DIAGNOSIS — D518 Other vitamin B12 deficiency anemias: Secondary | ICD-10-CM

## 2017-08-25 MED ORDER — CYANOCOBALAMIN 1000 MCG/ML IJ SOLN
1000.0000 ug | INTRAMUSCULAR | Status: AC
  Administered 2017-08-25 – 2018-08-09 (×6): 1000 ug via SUBCUTANEOUS

## 2017-08-25 NOTE — Progress Notes (Signed)
Patient came in for her B-12 injection. She tolerated it well. NKDA.   

## 2017-09-29 ENCOUNTER — Ambulatory Visit (INDEPENDENT_AMBULATORY_CARE_PROVIDER_SITE_OTHER): Payer: BLUE CROSS/BLUE SHIELD

## 2017-09-29 DIAGNOSIS — D519 Vitamin B12 deficiency anemia, unspecified: Secondary | ICD-10-CM

## 2017-09-29 MED ORDER — CYANOCOBALAMIN 1000 MCG/ML IJ SOLN
1000.0000 ug | Freq: Once | INTRAMUSCULAR | Status: AC
  Administered 2017-09-29: 1000 ug via INTRAMUSCULAR

## 2017-10-16 ENCOUNTER — Ambulatory Visit: Payer: BLUE CROSS/BLUE SHIELD | Admitting: Family Medicine

## 2017-11-16 ENCOUNTER — Other Ambulatory Visit: Payer: Self-pay | Admitting: Family Medicine

## 2017-11-16 ENCOUNTER — Telehealth: Payer: Self-pay | Admitting: Family Medicine

## 2017-11-16 ENCOUNTER — Ambulatory Visit (INDEPENDENT_AMBULATORY_CARE_PROVIDER_SITE_OTHER): Payer: BLUE CROSS/BLUE SHIELD

## 2017-11-16 VITALS — BP 130/80 | Ht 64.17 in | Wt 201.0 lb

## 2017-11-16 DIAGNOSIS — D518 Other vitamin B12 deficiency anemias: Secondary | ICD-10-CM

## 2017-11-16 DIAGNOSIS — E038 Other specified hypothyroidism: Secondary | ICD-10-CM

## 2017-11-16 NOTE — Progress Notes (Signed)
Patient came in for her B-12 injection. She tolerated it well. NKDA.  Lab Results  Component Value Date   VITAMINB12 179 (L) 04/17/2017

## 2017-11-17 LAB — TSH: TSH: 4.59 m[IU]/L — AB (ref 0.40–4.50)

## 2017-11-18 ENCOUNTER — Ambulatory Visit: Payer: BLUE CROSS/BLUE SHIELD | Admitting: Family Medicine

## 2017-11-18 ENCOUNTER — Encounter: Payer: Self-pay | Admitting: Family Medicine

## 2017-11-18 VITALS — BP 116/74 | HR 92 | Temp 97.9°F | Resp 16 | Ht 64.0 in | Wt 200.4 lb

## 2017-11-18 DIAGNOSIS — K432 Incisional hernia without obstruction or gangrene: Secondary | ICD-10-CM | POA: Diagnosis not present

## 2017-11-18 DIAGNOSIS — G4733 Obstructive sleep apnea (adult) (pediatric): Secondary | ICD-10-CM | POA: Diagnosis not present

## 2017-11-18 DIAGNOSIS — E669 Obesity, unspecified: Secondary | ICD-10-CM

## 2017-11-18 DIAGNOSIS — E039 Hypothyroidism, unspecified: Secondary | ICD-10-CM

## 2017-11-18 DIAGNOSIS — M797 Fibromyalgia: Secondary | ICD-10-CM

## 2017-11-18 MED ORDER — SYNTHROID 88 MCG PO TABS: 88.0000 ug | ORAL_TABLET | Freq: Every day | ORAL | 0 refills | Status: DC

## 2017-11-18 NOTE — Progress Notes (Signed)
Name: Claudia Norris   MRN: [id number]    DOB: 03/01/1956   Date:11/18/2017       Progress Note  Subjective  Chief Complaint  Chief Complaint  Patient presents with  . Medication Refill    6 month F/U  . Hypothyroidism    TSH came back abnormal and just filled her 88 mcq Synthroid.  . Sleep Apnea    Seeing Cardiologist Dr. Suella Grove at [facility]- states she has a sleep apnea-getting mask fitted and given Clonazepam to help her sleep has not started it yet.     HPI  Adult hypothyroidism: last TSH elevated, she went from levothyroxine 88 mcg to one daily and half on Sunday's. She states fatigue is not severe since started on b12 injections. Weight is stable.   GERD: she as pain with meals, not sure of how to described, uncomfortable, discussed Tums versus H2 blocker, eat slower, chew food more and if needed we will refer her to GI  Sleep Apnea: sent to cardiologist. Dr.Khan and had negative stress test but positive for OSA waiting for machine. Feels tired but not too bad, she has some snoring, usually worse when hot at night  Obesity: lost 2 lbs since last visit. Continue life style motivation.   Incisional hernia: going on for a long time, but decided to mention today, not causing any problems right now  B12: getting monthly injections.     Patient Active Problem List   Diagnosis Date Noted  . Personal history of colonic polyps   . Urinary tract infection 03/07/2016  . Family history of rectal cancer 02/29/2016  . Dyslipidemia 03/03/2015  . Gastro-esophageal reflux disease without esophagitis 03/03/2015  . History of cervical cancer 03/03/2015  . H/O: HTN (hypertension) 03/03/2015  . H/O: hysterectomy 03/03/2015  . Blood glucose elevated 03/03/2015  . Adult hypothyroidism 03/03/2015  . Obesity (BMI 30.0-34.9) 03/03/2015  . Plantar fasciitis 03/03/2015  . OSA (obstructive sleep apnea) 03/03/2015  . Closed dislocation of jaw 03/03/2015  . Fibromyalgia  syndrome 10/04/2009    Past Surgical History:  Procedure Laterality Date  . ABDOMINAL HYSTERECTOMY    . CESAREAN SECTION    . COLON SURGERY  2009   removed some of colon  . COLONOSCOPY WITH PROPOFOL N/A 04/18/2016   Procedure: COLONOSCOPY WITH PROPOFOL;  Surgeon: Midge Minium, MD;  Location: [facility];  Service: Endoscopy;  Laterality: N/A;  sleep apnea    Family History  Problem Relation Age of Onset  . Hypertension Mother   . Hypothyroidism Mother   . Heart disease Mother        bypass  . Stroke Mother   . Cancer - Other Mother   . Arthritis Father   . Dementia Father     Social History   Socioeconomic History  . Marital status: Married    Spouse name: [name]  . Number of children: 2  . Years of education: Not on file  . Highest education level: Some college, no degree  Occupational History  . Not on file  Social Needs  . Financial resource strain: Somewhat hard  . Food insecurity:    Worry: Never true    Inability: Never true  . Transportation needs:    Medical: No    Non-medical: No  Tobacco Use  . Smoking status: Never Smoker  . Smokeless tobacco: Never Used  Substance and Sexual Activity  . Alcohol use: No    Alcohol/week: 0.0 oz  . Drug use: No  .  Sexual activity: Yes    Birth control/protection: Post-menopausal  Lifestyle  . Physical activity:    Days per week: 3 days    Minutes per session: 30 min  . Stress: Not at all  Relationships  . Social connections:    Talks on phone: More than three times a week    Gets together: Once a week    Attends religious service: More than 4 times per year    Active member of club or organization: Yes    Attends meetings of clubs or organizations: More than 4 times per year    Relationship status: Married  . Intimate partner violence:    Fear of current or ex partner: No    Emotionally abused: No    Physically abused: No    Forced sexual activity: No  Other Topics Concern  . Not on file  Social  History Narrative   Married. Has two grown children.    One finished law school the youngest lives with grandmother in [place] - helping her out.   She takes care of her mother and father that live in [place]     Current Outpatient Medications:  .  fluticasone (FLONASE) 50 MCG/ACT nasal spray, Place 2 sprays into both nostrils daily. (Patient taking differently: Place 2 sprays into both nostrils as needed. ), Disp: 16 g, Rfl: 2 .  Nutritional Supplements (JUICE PLUS FIBRE PO), Take by mouth., Disp: , Rfl:  .  SYNTHROID 88 MCG tablet, Take 1 tablet (88 mcg total) by mouth daily before breakfast. Skips Sundays, Disp: 90 tablet, Rfl: 0 .  Vitamin D, Ergocalciferol, (DRISDOL) 50000 units CAPS capsule, TAKE 1 CAPSULE (50,000 UNITS TOTAL) BY MOUTH EVERY 7 (SEVEN) DAYS., Disp: 12 capsule, Rfl: 2 .  aspirin EC 81 MG tablet, Take 81 mg by mouth daily., Disp: , Rfl:   Current Facility-Administered Medications:  .  cyanocobalamin ((VITAMIN B-12)) injection 1,000 mcg, 1,000 mcg, Subcutaneous, Q30 days, Alba Cory, MD, 1,000 mcg at 11/16/17 0947  No Known Allergies   ROS  Constitutional: Negative for fever or weight change.  Respiratory: Negative for cough and shortness of breath.   Cardiovascular: Negative for chest pain or palpitations.  Gastrointestinal: Negative for abdominal pain, no bowel changes.  Musculoskeletal: Negative for gait problem or joint swelling.  Skin: Negative for rash.  Neurological: Negative for dizziness or headache.  No other specific complaints in a complete review of systems (except as listed in HPI above).  Objective  Vitals:   11/18/17 1007  BP: 116/74  Pulse: 92  Resp: 16  Temp: 97.9 F (36.6 C)  TempSrc: Oral  SpO2: 94%  Weight: 200 lb 6.4 oz (90.9 kg)  Height: 5\' 4"  (1.626 m)    Body mass index is 34.4 kg/m.  Physical Exam  Constitutional: Patient appears well-developed and well-nourished. Obese  No distress.  HEENT: head  atraumatic, normocephalic, pupils equal and reactive to light,  neck supple, throat within normal limits, no thyromegaly  Cardiovascular: Normal rate, regular rhythm and normal heart sounds.  No murmur heard. No BLE edema. Pulmonary/Chest: Effort normal and breath sounds normal. No respiratory distress. Abdominal: Soft.  There is no tenderness, incisional hernia on mid abdomen  Psychiatric: Patient has a normal mood and affect. behavior is normal. Judgment and thought content normal.  Recent Results (from the past 2160 hour(s))  TSH     Status: Abnormal   Collection Time: 11/16/17  2:30 PM  Result Value Ref Range   TSH 4.59 (  H) 0.40 - 4.50 mIU/L     PHQ2/9: Depression screen [facility] 2/9 11/18/2017 07/14/2017 04/17/2017 03/06/2015  Decreased Interest 0 0 0 0  Down, Depressed, Hopeless 0 0 0 0  PHQ - 2 Score 0 0 0 0  Altered sleeping 0 - - -  Tired, decreased energy 1 - - -  Change in appetite 0 - - -  Feeling bad or failure about yourself  0 - - -  Trouble concentrating 0 - - -  Moving slowly or fidgety/restless 0 - - -  Suicidal thoughts 0 - - -  PHQ-9 Score 1 - - -  Difficult doing work/chores Not difficult at all - - -     Fall Risk: Fall Risk  11/18/2017 07/14/2017 04/17/2017 03/06/2015  Falls in the past year? Yes Yes No No  Number falls in past yr: 1 1 - -  Comment Fell off a step ladder - - -  Injury with Fall? No Yes - -     Functional Status Survey: Is the patient deaf or have difficulty hearing?: No Does the patient have difficulty seeing, even when wearing glasses/contacts?: Yes(Prescription glasses) Does the patient have difficulty concentrating, remembering, or making decisions?: No Does the patient have difficulty walking or climbing stairs?: No Does the patient have difficulty dressing or bathing?: No Does the patient have difficulty doing errands alone such as visiting a doctor's office or shopping?: No   Assessment & Plan  1. Adult hypothyroidism  - SYNTHROID  88 MCG tablet; Take 1 tablet (88 mcg total) by mouth daily before breakfast. And half on Sundays  Dispense: 90 tablet; Refill: 0 - TSH  2. OSA (obstructive sleep apnea)  Diagnosed by Dr. Welton FlakesKhan , waiting to start on CPAP machine  3. Fibromyalgia syndrome  Stable  4. Obesity (BMI 30.0-34.9)  Discussed with the patient the risk posed by an increased BMI. Discussed importance of portion control, calorie counting and at least 150 minutes of physical activity weekly. Avoid sweet beverages and drink more water. Eat at least 6 servings of fruit and vegetables daily    5. Incisional hernia, without obstruction or gangrene  Discussed importance of going to surgeon/ec if redness, increase in warmth or pain

## 2017-11-24 ENCOUNTER — Ambulatory Visit
Admission: RE | Admit: 2017-11-24 | Discharge: 2017-11-24 | Disposition: A | Discharge status: Home / Self Care | Payer: BLUE CROSS/BLUE SHIELD | Source: Ambulatory Visit | Attending: Family Medicine | Admitting: Family Medicine

## 2017-11-24 ENCOUNTER — Encounter (INDEPENDENT_AMBULATORY_CARE_PROVIDER_SITE_OTHER): Payer: Self-pay

## 2017-11-24 ENCOUNTER — Encounter: Payer: Self-pay | Admitting: Family Medicine

## 2017-11-24 DIAGNOSIS — Z1231 Encounter for screening mammogram for malignant neoplasm of breast: Secondary | ICD-10-CM | POA: Insufficient documentation

## 2017-11-24 DIAGNOSIS — Z1239 Encounter for other screening for malignant neoplasm of breast: Secondary | ICD-10-CM

## 2017-11-24 DIAGNOSIS — E2839 Other primary ovarian failure: Secondary | ICD-10-CM | POA: Insufficient documentation

## 2017-11-24 DIAGNOSIS — Z13820 Encounter for screening for osteoporosis: Secondary | ICD-10-CM | POA: Diagnosis not present

## 2017-11-24 DIAGNOSIS — Z8262 Family history of osteoporosis: Secondary | ICD-10-CM | POA: Diagnosis present

## 2017-12-16 ENCOUNTER — Ambulatory Visit: Payer: BLUE CROSS/BLUE SHIELD | Admitting: Nurse Practitioner

## 2017-12-16 ENCOUNTER — Encounter: Payer: Self-pay | Admitting: Nurse Practitioner

## 2017-12-16 VITALS — BP 120/76 | HR 95 | Temp 99.0°F | Resp 16 | Ht 64.0 in | Wt 202.7 lb

## 2017-12-16 DIAGNOSIS — R05 Cough: Secondary | ICD-10-CM

## 2017-12-16 DIAGNOSIS — R059 Cough, unspecified: Secondary | ICD-10-CM

## 2017-12-16 DIAGNOSIS — J069 Acute upper respiratory infection, unspecified: Secondary | ICD-10-CM

## 2017-12-16 MED ORDER — BENZONATATE 200 MG PO CAPS: 200.0000 mg | ORAL_CAPSULE | Freq: Two times a day (BID) | ORAL | 0 refills | Status: DC | PRN

## 2017-12-16 MED ORDER — LEVOCETIRIZINE DIHYDROCHLORIDE 5 MG PO TABS: 5.0000 mg | ORAL_TABLET | Freq: Every evening | ORAL | 3 refills | Status: DC

## 2017-12-16 NOTE — Patient Instructions (Addendum)
-   Continue a non-drowsy anti-histamine like one of the following:cetirizine, levocetirizine, loratadine, desloratadine, and fexofenadine. Sent levocetirizine to the pharmacy - take tylenol (no more than 3,000mg  a day) for aches and pain, and then additional ibuprofen if needed (2,400 mg a day) - flonase at night  - Neti pot - Cool mist humidifier - guaifenesin, musinex  - tessalon pearls ( sent prescription for cough if needed) - drinking plenty of water so your urine clear (recommendation is 64 ounces a day make it a goal to at least his 48 ounces)   - If your symptoms are not improving on Friday call us in the afternoon to let us know    You likely have a viral upper respiratory infection (URI). Antibiotics will not reduce the number of days you are ill or prevent you from getting bacterial rhinosinusitis. A URI can take up to 14 days to resolve, but typically last between 7-11 days. Your body is so smart and strong that it will be fighting this illness off for you but it is important that you drink plenty of fluids, rest. Cover your nose/mouth when you cough or sneeze and wash your hands well and often. Here are some helpful things you can use or pick up over the counter from the pharmacy to help with your symptoms:   For Fever/Pain: Acetaminophen every 6 hours as needed (maximum of  a day). If you are still uncomfortable you can add ibuprofen OR naproxen  For coughing: try dextromethorphan for a cough suppressant, and/or a cool mist humidifier, lozenges  For sore throat: saline gargles, honey herbal tea, lozenges, throat spray  To dry out your nose: try an antihistamine like loratadine (non-sedating) or diphenhydramine (sedating) or others To relieve a stuffy nose: try an oral decongestant  Like pseudoephedrine if you are under the age of 13 and do not have high blood pressure, neti pot To make blowing your nose easier: guaifenesin

## 2017-12-16 NOTE — Progress Notes (Signed)
Name: Claudia Norris   MRN: [id number]    DOB: 1955/08/18   Date:12/16/2017       Progress Note  Subjective  Chief Complaint  Chief Complaint  Patient presents with  . Sinusitis    for 1 week    HPI  Patient presents with throat drainage, dry hacking cough, nasal congestion. Feels like it is going into her chest. Started CPAP machine 3 weeks ago. Patient denies fever,  Pt endorses mild bilateral ear pressure or fullness, headache, watery eyes Symptoms have been ongoing for 7 days. Pt sts all started at the same times and feels it has gotten worse except for cough- has improved some. Endorses mild fatigue.  History of allergies states was mowing lawn when it all started.   Patient has tried OTC antihistamine taken twice a day.  Patient Active Problem List   Diagnosis Date Noted  . Personal history of colonic polyps   . Urinary tract infection 03/07/2016  . Family history of rectal cancer 02/29/2016  . Dyslipidemia 03/03/2015  . Gastro-esophageal reflux disease without esophagitis 03/03/2015  . History of cervical cancer 03/03/2015  . H/O: HTN (hypertension) 03/03/2015  . H/O: hysterectomy 03/03/2015  . Blood glucose elevated 03/03/2015  . Adult hypothyroidism 03/03/2015  . Obesity (BMI 30.0-34.9) 03/03/2015  . Plantar fasciitis 03/03/2015  . OSA (obstructive sleep apnea) 03/03/2015  . Closed dislocation of jaw 03/03/2015  . Fibromyalgia syndrome 10/04/2009    Past Medical History:  Diagnosis Date  . GERD (gastroesophageal reflux disease)   . Headache    tension  . Motion sickness    car  . Sleep apnea    no cpap  . Thyroid disease   . Vertigo    in past    Past Surgical History:  Procedure Laterality Date  . ABDOMINAL HYSTERECTOMY    . CESAREAN SECTION    . COLON SURGERY  2009   removed some of colon  . COLONOSCOPY WITH PROPOFOL N/A 04/18/2016   Procedure: COLONOSCOPY WITH PROPOFOL;  Surgeon: Midge Minium, MD;  Location: [facility];  Service:  Endoscopy;  Laterality: N/A;  sleep apnea    Social History   Tobacco Use  . Smoking status: Never Smoker  . Smokeless tobacco: Never Used  Substance Use Topics  . Alcohol use: No    Alcohol/week: 0.0 oz     Current Outpatient Medications:  .  fluticasone (FLONASE) 50 MCG/ACT nasal spray, Place 2 sprays into both nostrils daily. (Patient taking differently: Place 2 sprays into both nostrils as needed. ), Disp: 16 g, Rfl: 2 .  Nutritional Supplements (JUICE PLUS FIBRE PO), Take by mouth., Disp: , Rfl:  .  SYNTHROID 88 MCG tablet, Take 1 tablet (88 mcg total) by mouth daily before breakfast. And half on Sundays, Disp: 90 tablet, Rfl: 0 .  Vitamin D, Ergocalciferol, (DRISDOL) 50000 units CAPS capsule, TAKE 1 CAPSULE (50,000 UNITS TOTAL) BY MOUTH EVERY 7 (SEVEN) DAYS., Disp: 12 capsule, Rfl: 2 .  aspirin EC 81 MG tablet, Take 81 mg by mouth daily., Disp: , Rfl:   Current Facility-Administered Medications:  .  cyanocobalamin ((VITAMIN B-12)) injection 1,000 mcg, 1,000 mcg, Subcutaneous, Q30 days, Alba Cory, MD, 1,000 mcg at 11/16/17 0947  No Known Allergies  ROS   No other specific complaints in a complete review of systems (except as listed in HPI above).  Objective  Vitals:   12/16/17 1451  BP: 120/76  Pulse: 95  Resp: 16  Temp: 99 F (37.2 C)  TempSrc: Oral  SpO2: 94%  Weight: 202 lb 11.2 oz (91.9 kg)  Height:  (1.626 m)    Body mass index is 34.79 kg/m.  Nursing Note and Vital Signs reviewed.  Physical Exam   Constitutional: Patient appears well-developed and well-nourished. Obese  No distress.  HEENT: head atraumatic, normocephalic, pupils equal and reactive to light, TM's without erythema or bulging ( initially impacted by cerumen but irrigates out),  no maxillary or frontal sinus tenderness , neck supple without lymphadenopathy, oropharynx pink and moist without exudate, no nasal discharge Cardiovascular: Normal rate, regular rhythm, S1/S2 present.   No murmur or rub heard.  Pulmonary/Chest: Effort normal and breath sounds clear. No respiratory distress or retractions. Psychiatric: Patient has a normal mood and affect. behavior is normal. Judgment and thought content normal.  No results found for this or any previous visit (from the past 72 hour(s)).  Assessment & Plan  1. Upper respiratory tract infection, unspecified type -discussed otc management  - levocetirizine (XYZAL) 5 MG tablet; Take 1 tablet (5 mg total) by mouth every evening.  Dispense: 30 tablet; Refill: 3  2. Cough - benzonatate (TESSALON) 200 MG capsule; Take 1 capsule (200 mg total) by mouth 2 (two) times daily as needed for cough.  Dispense: 20 capsule; Refill: 0   - Continue a non-drowsy anti-histamine like one of the following:cetirizine, levocetirizine, loratadine, desloratadine, and fexofenadine. Sent levocetirizine to the pharmacy - take tylenol (no more than 3,000mg  a day) for aches and pain, and then additional ibuprofen if needed (2,400 mg a day) - flonase at night  - Neti pot - Cool mist humidifier - guaifenesin, musinex  - tessalon pearls ( sent prescription for cough if needed) - drinking plenty of water so your urine clear (recommendation is 64 ounces a day make it a goal to at least his 48 ounces)   - If your symptoms are not improving on Friday call us in the afternoon to let us know   -Red flags and when to present for emergency care or RTC including fever >101.42F, chest pain, shortness of breath, new/worsening/un-resolving symptoms,  reviewed with patient at time of visit. Follow up and care instructions discussed and provided in AVS.

## 2018-01-04 ENCOUNTER — Ambulatory Visit: Payer: BLUE CROSS/BLUE SHIELD

## 2018-01-04 ENCOUNTER — Ambulatory Visit: Payer: BLUE CROSS/BLUE SHIELD | Admitting: Family Medicine

## 2018-01-04 ENCOUNTER — Encounter: Payer: Self-pay | Admitting: Family Medicine

## 2018-01-04 VITALS — BP 146/78 | HR 79 | Temp 98.3°F | Resp 16 | Ht 64.0 in | Wt 202.3 lb

## 2018-01-04 DIAGNOSIS — M25511 Pain in right shoulder: Secondary | ICD-10-CM

## 2018-01-04 DIAGNOSIS — R030 Elevated blood-pressure reading, without diagnosis of hypertension: Secondary | ICD-10-CM | POA: Diagnosis not present

## 2018-01-04 DIAGNOSIS — G4733 Obstructive sleep apnea (adult) (pediatric): Secondary | ICD-10-CM | POA: Diagnosis not present

## 2018-01-04 DIAGNOSIS — R350 Frequency of micturition: Secondary | ICD-10-CM

## 2018-01-04 DIAGNOSIS — D518 Other vitamin B12 deficiency anemias: Secondary | ICD-10-CM | POA: Diagnosis not present

## 2018-01-04 MED ORDER — NITROFURANTOIN MONOHYD MACRO 100 MG PO CAPS: 100.0000 mg | ORAL_CAPSULE | Freq: Two times a day (BID) | ORAL | 0 refills | Status: DC

## 2018-01-04 NOTE — Patient Instructions (Signed)
Shoulder Exercises Ask your health care provider which exercises are safe for you. Do exercises exactly as told by your health care provider and adjust them as directed. It is normal to feel mild stretching, pulling, tightness, or discomfort as you do these exercises, but you should stop right away if you feel sudden pain or your pain gets worse.Do not begin these exercises until told by your health care provider. RANGE OF MOTION EXERCISES These exercises warm up your muscles and joints and improve the movement and flexibility of your shoulder. These exercises also help to relieve pain, numbness, and tingling. These exercises involve stretching your injured shoulder directly. Exercise A: Pendulum  1. Stand near a wall or a surface that you can hold onto for balance. 2. Bend at the waist and let your left / right arm hang straight down. Use your other arm to support you. Keep your back straight and do not lock your knees. 3. Relax your left / right arm and shoulder muscles, and move your hips and your trunk so your left / right arm swings freely. Your arm should swing because of the motion of your body, not because you are using your arm or shoulder muscles. 4. Keep moving your body so your arm swings in the following directions, as told by your health care provider: ? Side to side. ? Forward and backward. ? In clockwise and counterclockwise circles. 5. Continue each motion for __________ seconds, or for as long as told by your health care provider. 6. Slowly return to the starting position. Repeat __________ times. Complete this exercise __________ times a day. Exercise B:Flexion, Standing  1. Stand and hold a broomstick, a cane, or a similar object. Place your hands a little more than shoulder-width apart on the object. Your left / right hand should be palm-up, and your other hand should be palm-down. 2. Keep your elbow straight and keep your shoulder muscles relaxed. Push the stick down with  your healthy arm to raise your left / right arm in front of your body, and then over your head until you feel a stretch in your shoulder. ? Avoid shrugging your shoulder while you raise your arm. Keep your shoulder blade tucked down toward the middle of your back. 3. Hold for __________ seconds. 4. Slowly return to the starting position. Repeat __________ times. Complete this exercise __________ times a day. Exercise C: Abduction, Standing 1. Stand and hold a broomstick, a cane, or a similar object. Place your hands a little more than shoulder-width apart on the object. Your left / right hand should be palm-up, and your other hand should be palm-down. 2. While keeping your elbow straight and your shoulder muscles relaxed, push the stick across your body toward your left / right side. Raise your left / right arm to the side of your body and then over your head until you feel a stretch in your shoulder. ? Do not raise your arm above shoulder height, unless your health care provider tells you to do that. ? Avoid shrugging your shoulder while you raise your arm. Keep your shoulder blade tucked down toward the middle of your back. 3. Hold for __________ seconds. 4. Slowly return to the starting position. Repeat __________ times. Complete this exercise __________ times a day. Exercise D:Internal Rotation  1. Place your left / right hand behind your back, palm-up. 2. Use your other hand to dangle an exercise band, a towel, or a similar object over your shoulder. Grasp the band with   your left / right hand so you are holding onto both ends. 3. Gently pull up on the band until you feel a stretch in the front of your left / right shoulder. ? Avoid shrugging your shoulder while you raise your arm. Keep your shoulder blade tucked down toward the middle of your back. 4. Hold for __________ seconds. 5. Release the stretch by letting go of the band and lowering your hands. Repeat __________ times. Complete  this exercise __________ times a day. STRETCHING EXERCISES These exercises warm up your muscles and joints and improve the movement and flexibility of your shoulder. These exercises also help to relieve pain, numbness, and tingling. These exercises are done using your healthy shoulder to help stretch the muscles of your injured shoulder. Exercise E: Corner Stretch (External Rotation and Abduction)  1. Stand in a doorway with one of your feet slightly in front of the other. This is called a staggered stance. If you cannot reach your forearms to the door frame, stand facing a corner of a room. 2. Choose one of the following positions as told by your health care provider: ? Place your hands and forearms on the door frame above your head. ? Place your hands and forearms on the door frame at the height of your head. ? Place your hands on the door frame at the height of your elbows. 3. Slowly move your weight onto your front foot until you feel a stretch across your chest and in the front of your shoulders. Keep your head and chest upright and keep your abdominal muscles tight. 4. Hold for __________ seconds. 5. To release the stretch, shift your weight to your back foot. Repeat __________ times. Complete this stretch __________ times a day. Exercise F:Extension, Standing 1. Stand and hold a broomstick, a cane, or a similar object behind your back. ? Your hands should be a little wider than shoulder-width apart. ? Your palms should face away from your back. 2. Keeping your elbows straight and keeping your shoulder muscles relaxed, move the stick away from your body until you feel a stretch in your shoulder. ? Avoid shrugging your shoulders while you move the stick. Keep your shoulder blade tucked down toward the middle of your back. 3. Hold for __________ seconds. 4. Slowly return to the starting position. Repeat __________ times. Complete this exercise __________ times a day. STRENGTHENING  EXERCISES These exercises build strength and endurance in your shoulder. Endurance is the ability to use your muscles for a long time, even after they get tired. Exercise G:External Rotation  1. Sit in a stable chair without armrests. 2. Secure an exercise band at elbow height on your left / right side. 3. Place a soft object, such as a folded towel or a small pillow, between your left / right upper arm and your body to move your elbow a few inches away (about 10 cm) from your side. 4. Hold the end of the band so it is tight and there is no slack. 5. Keeping your elbow pressed against the soft object, move your left / right forearm out, away from your abdomen. Keep your body steady so only your forearm moves. 6. Hold for __________ seconds. 7. Slowly return to the starting position. Repeat __________ times. Complete this exercise __________ times a day. Exercise H:Shoulder Abduction  1. Sit in a stable chair without armrests, or stand. 2. Hold a __________ weight in your left / right hand, or hold an exercise band with both hands.   3. Start with your arms straight down and your left / right palm facing in, toward your body. 4. Slowly lift your left / right hand out to your side. Do not lift your hand above shoulder height unless your health care provider tells you that this is safe. ? Keep your arms straight. ? Avoid shrugging your shoulder while you do this movement. Keep your shoulder blade tucked down toward the middle of your back. 5. Hold for __________ seconds. 6. Slowly lower your arm, and return to the starting position. Repeat __________ times. Complete this exercise __________ times a day. Exercise I:Shoulder Extension 1. Sit in a stable chair without armrests, or stand. 2. Secure an exercise band to a stable object in front of you where it is at shoulder height. 3. Hold one end of the exercise band in each hand. Your palms should face each other. 4. Straighten your elbows and  lift your hands up to shoulder height. 5. Step back, away from the secured end of the exercise band, until the band is tight and there is no slack. 6. Squeeze your shoulder blades together as you pull your hands down to the sides of your thighs. Stop when your hands are straight down by your sides. Do not let your hands go behind your body. 7. Hold for __________ seconds. 8. Slowly return to the starting position. Repeat __________ times. Complete this exercise __________ times a day. Exercise J:Standing Shoulder Row 1. Sit in a stable chair without armrests, or stand. 2. Secure an exercise band to a stable object in front of you so it is at waist height. 3. Hold one end of the exercise band in each hand. Your palms should be in a thumbs-up position. 4. Bend each of your elbows to an "L" shape (about 90 degrees) and keep your upper arms at your sides. 5. Step back until the band is tight and there is no slack. 6. Slowly pull your elbows back behind you. 7. Hold for __________ seconds. 8. Slowly return to the starting position. Repeat __________ times. Complete this exercise __________ times a day. Exercise K:Shoulder Press-Ups  1. Sit in a stable chair that has armrests. Sit upright, with your feet flat on the floor. 2. Put your hands on the armrests so your elbows are bent and your fingers are pointing forward. Your hands should be about even with the sides of your body. 3. Push down on the armrests and use your arms to lift yourself off of the chair. Straighten your elbows and lift yourself up as much as you comfortably can. ? Move your shoulder blades down, and avoid letting your shoulders move up toward your ears. ? Keep your feet on the ground. As you get stronger, your feet should support less of your body weight as you lift yourself up. 4. Hold for __________ seconds. 5. Slowly lower yourself back into the chair. Repeat __________ times. Complete this exercise __________ times a  day. Exercise L: Wall Push-Ups  1. Stand so you are facing a stable wall. Your feet should be about one arm-length away from the wall. 2. Lean forward and place your palms on the wall at shoulder height. 3. Keep your feet flat on the floor as you bend your elbows and lean forward toward the wall. 4. Hold for __________ seconds. 5. Straighten your elbows to push yourself back to the starting position. Repeat __________ times. Complete this exercise __________ times a day. This information is not intended to replace advice   given to you by your health care provider. Make sure you discuss any questions you have with your health care provider. Document Released: 05/28/2005 Document Revised: 04/07/2016 Document Reviewed: 03/25/2015 [employer] Interactive Patient Education  2018 [employer]  

## 2018-01-04 NOTE — Progress Notes (Addendum)
Name: Claudia Norris   MRN: [id number]    DOB: 10/06/1955   Date:01/04/2018       Progress Note  Subjective  Chief Complaint  Chief Complaint  Patient presents with  . Urinary Tract Infection    Onset- Thursday, urinary burning, frequency, strong odor    HPI   Urinary frequency: she states that for the past 4 days , she has noticed urinary frequency, urine odor and mild dysuria and lower back pain. No blood in urine or fever. Denies nausea or vomiting.   OSA: we will obtain records from Dr. Welton FlakesKhan, she has been compliant with CPAP for the past 5 weeks, and wears for at least 6 hours.   Right shoulder pain: she states that since wearing CPAP machine she only sleeps on right side and two nights ago developed right shoulder pain, she has been taking ibuprofen for the past 2 days. BP is elevated today, previous history of HTN, but bp has been under control for a long time without medications  B12: she came in for B12 injections, had it previously and no side effects. She has fatigue, but improved with B12 injections and CPAP   Patient Active Problem List   Diagnosis Date Noted  . Personal history of colonic polyps   . Family history of rectal cancer 02/29/2016  . Dyslipidemia 03/03/2015  . Gastro-esophageal reflux disease without esophagitis 03/03/2015  . History of cervical cancer 03/03/2015  . H/O: HTN (hypertension) 03/03/2015  . Blood glucose elevated 03/03/2015  . Adult hypothyroidism 03/03/2015  . Obesity (BMI 30.0-34.9) 03/03/2015  . Plantar fasciitis 03/03/2015  . OSA (obstructive sleep apnea) 03/03/2015  . Closed dislocation of jaw 03/03/2015  . Fibromyalgia syndrome 10/04/2009    Social History   Tobacco Use  . Smoking status: Never Smoker  . Smokeless tobacco: Never Used  Substance Use Topics  . Alcohol use: No    Alcohol/week: 0.0 oz     Current Outpatient Medications:  .  aspirin EC 81 MG tablet, Take 81 mg by mouth daily., Disp: , Rfl:  .  fluticasone  (FLONASE) 50 MCG/ACT nasal spray, Place 2 sprays into both nostrils daily. (Patient taking differently: Place 2 sprays into both nostrils as needed. ), Disp: 16 g, Rfl: 2 .  Nutritional Supplements (JUICE PLUS FIBRE PO), Take by mouth., Disp: , Rfl:  .  SYNTHROID 88 MCG tablet, Take 1 tablet (88 mcg total) by mouth daily before breakfast. And half on Sundays, Disp: 90 tablet, Rfl: 0 .  Vitamin D, Ergocalciferol, (DRISDOL) 50000 units CAPS capsule, TAKE 1 CAPSULE (50,000 UNITS TOTAL) BY MOUTH EVERY 7 (SEVEN) DAYS., Disp: 12 capsule, Rfl: 2 .  nitrofurantoin, macrocrystal-monohydrate, (MACROBID) 100 MG capsule, Take 1 capsule (100 mg total) by mouth 2 (two) times daily., Disp: 10 capsule, Rfl: 0  Current Facility-Administered Medications:  .  cyanocobalamin ((VITAMIN B-12)) injection 1,000 mcg, 1,000 mcg, Subcutaneous, Q30 days, Alba CorySowles, Kastin Cerda, MD, 1,000 mcg at 01/04/18 0906  No Known Allergies  ROS  Ten systems reviewed and is negative except as mentioned in HPI   Objective  Vitals:   01/04/18 0903 01/04/18 1000  BP: (!) 152/76 (!) 146/78  Pulse: 79   Resp: 16   Temp: 98.3 F (36.8 C)   TempSrc: Oral   SpO2: 97%   Weight: 202 lb 4.8 oz (91.8 kg)   Height: 5\' 4"  (1.626 m)     Body mass index is 34.72 kg/m.    Physical Exam  Constitutional: Patient appears well-developed  and well-nourished. Obese No distress.  HEENT: head atraumatic, normocephalic, pupils equal and reactive to light, neck supple, throat within normal limits Cardiovascular: Normal rate, regular rhythm and normal heart sounds.  No murmur heard. No BLE edema. Pulmonary/Chest: Effort normal and breath sounds normal. No respiratory distress. Abdominal: Soft.  There is no tenderness. CVA tenderness  Muscular skeletal: pain during palpation of right scapular area, and trapezium muscle. Normal rom of right shoulder  Psychiatric: Patient has a normal mood and affect. behavior is normal. Judgment and thought content  normal.  Recent Results (from the past 2160 hour(s))  TSH     Status: Abnormal   Collection Time: 11/16/17  2:30 PM  Result Value Ref Range   TSH 4.59 (H) 0.40 - 4.50 mIU/L     Assessment & Plan  1. Urinary frequency  - CULTURE, URINE COMPREHENSIVE - nitrofurantoin, macrocrystal-monohydrate, (MACROBID) 100 MG capsule; Take 1 capsule (100 mg total) by mouth 2 (two) times daily.  Dispense: 10 capsule; Refill: 0  2. Other vitamin B12 deficiency anemia  -B12   3. Elevated blood pressure reading  Monitor for now, stop ibuprofen, try topical otc medication and tylenol for shoulder pain   4. OSA (obstructive sleep apnea)  Very compliant with CPAP   5. Acute pain of right shoulder  I will give her shoulder exercises

## 2018-01-06 LAB — CULTURE, URINE COMPREHENSIVE
MICRO NUMBER:: 90693357
SPECIMEN QUALITY: ADEQUATE

## 2018-01-07 ENCOUNTER — Encounter: Payer: Self-pay | Admitting: Family Medicine

## 2018-01-07 DIAGNOSIS — I517 Cardiomegaly: Secondary | ICD-10-CM | POA: Insufficient documentation

## 2018-01-11 ENCOUNTER — Encounter: Payer: Self-pay | Admitting: Family Medicine

## 2018-01-13 ENCOUNTER — Other Ambulatory Visit: Payer: Self-pay | Admitting: Family Medicine

## 2018-01-13 DIAGNOSIS — H6981 Other specified disorders of Eustachian tube, right ear: Secondary | ICD-10-CM

## 2018-01-13 NOTE — Telephone Encounter (Signed)
Refill request for general medication: Flonase Nasal Spray   Last office visit: 01/04/2018  Last physical exam: 07/15/2017  Follow-ups on file. 05/20/2018

## 2018-01-14 ENCOUNTER — Encounter: Payer: Self-pay | Admitting: Family Medicine

## 2018-03-25 ENCOUNTER — Other Ambulatory Visit: Payer: Self-pay | Admitting: Family Medicine

## 2018-03-30 ENCOUNTER — Ambulatory Visit (INDEPENDENT_AMBULATORY_CARE_PROVIDER_SITE_OTHER): Payer: BLUE CROSS/BLUE SHIELD

## 2018-03-30 DIAGNOSIS — D518 Other vitamin B12 deficiency anemias: Secondary | ICD-10-CM | POA: Diagnosis not present

## 2018-03-30 DIAGNOSIS — D519 Vitamin B12 deficiency anemia, unspecified: Secondary | ICD-10-CM

## 2018-05-10 ENCOUNTER — Ambulatory Visit (INDEPENDENT_AMBULATORY_CARE_PROVIDER_SITE_OTHER): Payer: BLUE CROSS/BLUE SHIELD

## 2018-05-10 DIAGNOSIS — Z23 Encounter for immunization: Secondary | ICD-10-CM

## 2018-05-10 DIAGNOSIS — D518 Other vitamin B12 deficiency anemias: Secondary | ICD-10-CM

## 2018-05-10 MED ORDER — CYANOCOBALAMIN 1000 MCG/ML IJ SOLN
1000.0000 ug | Freq: Once | INTRAMUSCULAR | Status: AC
  Administered 2018-05-10: 1000 ug via INTRAMUSCULAR

## 2018-05-18 LAB — TSH: TSH: 0.27 m[IU]/L — AB (ref 0.40–4.50)

## 2018-05-19 ENCOUNTER — Other Ambulatory Visit: Payer: Self-pay | Admitting: Family Medicine

## 2018-05-19 DIAGNOSIS — E039 Hypothyroidism, unspecified: Secondary | ICD-10-CM

## 2018-05-19 MED ORDER — LEVOTHYROXINE SODIUM 75 MCG PO TABS: 75.0000 ug | ORAL_TABLET | Freq: Every day | ORAL | 1 refills | Status: DC

## 2018-05-20 ENCOUNTER — Ambulatory Visit: Payer: BLUE CROSS/BLUE SHIELD | Admitting: Family Medicine

## 2018-05-20 ENCOUNTER — Encounter: Payer: Self-pay | Admitting: Family Medicine

## 2018-05-20 VITALS — BP 128/76 | HR 93 | Temp 97.6°F | Resp 16 | Ht 64.0 in | Wt 201.8 lb

## 2018-05-20 DIAGNOSIS — E039 Hypothyroidism, unspecified: Secondary | ICD-10-CM

## 2018-05-20 DIAGNOSIS — G4733 Obstructive sleep apnea (adult) (pediatric): Secondary | ICD-10-CM | POA: Diagnosis not present

## 2018-05-20 DIAGNOSIS — E559 Vitamin D deficiency, unspecified: Secondary | ICD-10-CM

## 2018-05-20 DIAGNOSIS — K5909 Other constipation: Secondary | ICD-10-CM

## 2018-05-20 DIAGNOSIS — R300 Dysuria: Secondary | ICD-10-CM | POA: Diagnosis not present

## 2018-05-20 DIAGNOSIS — E785 Hyperlipidemia, unspecified: Secondary | ICD-10-CM

## 2018-05-20 DIAGNOSIS — Z87440 Personal history of urinary (tract) infections: Secondary | ICD-10-CM

## 2018-05-20 DIAGNOSIS — R739 Hyperglycemia, unspecified: Secondary | ICD-10-CM

## 2018-05-20 DIAGNOSIS — Z23 Encounter for immunization: Secondary | ICD-10-CM

## 2018-05-20 DIAGNOSIS — E538 Deficiency of other specified B group vitamins: Secondary | ICD-10-CM

## 2018-05-20 DIAGNOSIS — I10 Essential (primary) hypertension: Secondary | ICD-10-CM

## 2018-05-20 LAB — POCT URINALYSIS DIPSTICK
BILIRUBIN UA: NEGATIVE
GLUCOSE UA: NEGATIVE
Ketones, UA: NEGATIVE
Nitrite, UA: POSITIVE
Protein, UA: NEGATIVE
Spec Grav, UA: 1.015 (ref 1.010–1.025)
Urobilinogen, UA: NEGATIVE E.U./dL — AB
pH, UA: 6 (ref 5.0–8.0)

## 2018-05-20 MED ORDER — LISINOPRIL 10 MG PO TABS: 10.0000 mg | ORAL_TABLET | Freq: Every day | ORAL | 2 refills | Status: DC

## 2018-05-20 MED ORDER — CIPROFLOXACIN HCL 250 MG PO TABS: 250.0000 mg | ORAL_TABLET | Freq: Two times a day (BID) | ORAL | 0 refills | Status: DC

## 2018-05-20 NOTE — Patient Instructions (Signed)
Return in 6 weeks for Tdap ( tetanus shot)  BP check  And labs ( hgbA1C, lipid panel, comp panel, b12, vitamin D, hgbA1C, TSH)

## 2018-05-20 NOTE — Progress Notes (Signed)
Name: Claudia Norris   MRN: [id number]    DOB: [birth year]   Date:05/20/2018       Progress Note  Subjective  Chief Complaint  Chief Complaint  Patient presents with  . Medication Refill  . Hypothyroidism  . Sleep Apnea  . Obesity  . Urinary Tract Infection  . Gastroesophageal Reflux  . vitamin B12 deficiency  . Vitamin D deficiency  . incisional hernia    HPI  Urinary frequency: she states that for the past week she has noticed  urine odor  and lower back pain. No blood in urine or fever. Denies nausea or vomiting.  Last UTI was June 2019 treated with Macrobid   OSA: we will obtain records from Dr. Welton Flakes, she has been compliant with CPAP since May 2019, she has not noticed increase in energy, but has been compliant.   HTN: bp was elevated on her last visit and also today, she denies chest pain or palpitation, she states bp seems to improve when she walks daily. She has been under more stress, she is still going back and forth to [place] to care for her parents.   B12: still getting B12 injections monthly, and we will recheck level of B12 and also CBC  Obesity: trying to walk, eating healthier, discussed portion control  Hypothyroidism: last TSH , two days ago was suppressed, taking levothyroxine , she was supposed to take 88 mcg daily and half on Sundays but she was taking one daily, we just sent a new rx to 75 mcg to take one daily. She is feeling more than usual and bp is high today. No dysphagia  Vitamin D deficiency: she has been taking rx vitamin D   Hernia: stable, no pain.   GERD: under control with life style modification, takes Tums and prilosec prn, but doing very well with Activia yogurt  Chronic constipation: has bowel movements about twice a week, all her life, she has started eating apples and seems to be helping, she does not want medications at this time   Patient Active Problem List   Diagnosis Date Noted  . Left ventricular hypertrophy by electrocardiogram  01/07/2018  . Personal history of colonic polyps   . Family history of rectal cancer 02/29/2016  . Dyslipidemia 03/03/2015  . Gastro-esophageal reflux disease without esophagitis 03/03/2015  . History of cervical cancer 03/03/2015  . H/O: HTN (hypertension) 03/03/2015  . Blood glucose elevated 03/03/2015  . Adult hypothyroidism 03/03/2015  . Obesity (BMI 30.0-34.9) 03/03/2015  . Plantar fasciitis 03/03/2015  . OSA (obstructive sleep apnea) 03/03/2015  . Closed dislocation of jaw 03/03/2015  . Fibromyalgia syndrome 10/04/2009    Past Surgical History:  Procedure Laterality Date  . ABDOMINAL HYSTERECTOMY    . CESAREAN SECTION    . COLON SURGERY  2009   removed some of colon  . COLONOSCOPY WITH PROPOFOL N/A 04/18/2016   Procedure: COLONOSCOPY WITH PROPOFOL;  Surgeon: Midge Minium, MD;  Location: [facility];  Service: Endoscopy;  Laterality: N/A;  sleep apnea    Family History  Problem Relation Age of Onset  . Hypertension Mother   . Hypothyroidism Mother   . Heart disease Mother        bypass  . Stroke Mother   . Cancer - Other Mother        rectal  . Arthritis Father   . Dementia Father   . Breast cancer Maternal Aunt        mat great aunt  Social History   Socioeconomic History  . Marital status: Married    Spouse name: [name]  . Number of children: 2  . Years of education: Not on file  . Highest education level: Some college, no degree  Occupational History  . Not on file  Social Needs  . Financial resource strain: Somewhat hard  . Food insecurity:    Worry: Never true    Inability: Never true  . Transportation needs:    Medical: No    Non-medical: No  Tobacco Use  . Smoking status: Never Smoker  . Smokeless tobacco: Never Used  Substance and Sexual Activity  . Alcohol use: No    Alcohol/week: 0.0 standard drinks  . Drug use: No  . Sexual activity: Yes    Partners: Male    Birth control/protection: Post-menopausal  Lifestyle  . Physical  activity:    Days per week: 3 days    Minutes per session: 30 min  . Stress: Not at all  Relationships  . Social connections:    Talks on phone: More than three times a week    Gets together: Once a week    Attends religious service: More than 4 times per year    Active member of club or organization: Yes    Attends meetings of clubs or organizations: More than 4 times per year    Relationship status: Married  . Intimate partner violence:    Fear of current or ex partner: No    Emotionally abused: No    Physically abused: No    Forced sexual activity: No  Other Topics Concern  . Not on file  Social History Narrative   Married. Has two grown children.    One finished law school the youngest lives with grandmother in [place] - helping her out.   She takes care of her mother and father that live in [place]      Her maternal aunt takes care of her mother in [place]      Dad has been recently put in a nursing home.     Current Outpatient Medications:  .  aspirin EC 81 MG tablet, Take 81 mg by mouth daily., Disp: , Rfl:  .  fluticasone (FLONASE) 50 MCG/ACT nasal spray, Place 2 sprays into both nostrils as needed., Disp: 16 g, Rfl: 11 .  levothyroxine (SYNTHROID, LEVOTHROID) 75 MCG tablet, Take 1 tablet (75 mcg total) by mouth daily., Disp: 30 tablet, Rfl: 1 .  lisinopril (PRINIVIL,ZESTRIL) 10 MG tablet, Take 1 tablet (10 mg total) by mouth daily., Disp: 30 tablet, Rfl: 2 .  nitrofurantoin, macrocrystal-monohydrate, (MACROBID) 100 MG capsule, Take 1 capsule (100 mg total) by mouth 2 (two) times daily., Disp: 10 capsule, Rfl: 0 .  Nutritional Supplements (JUICE PLUS FIBRE PO), Take by mouth., Disp: , Rfl:  .  Vitamin D, Ergocalciferol, (DRISDOL) 50000 units CAPS capsule, TAKE 1 CAPSULE (50,000 UNITS TOTAL) BY MOUTH EVERY 7 (SEVEN) DAYS., Disp: 12 capsule, Rfl: 2  Current Facility-Administered Medications:  .  cyanocobalamin ((VITAMIN B-12)) injection 1,000 mcg, 1,000 mcg,  Subcutaneous, Q30 days, Alba Cory, MD, 1,000 mcg at 03/30/18 1401  No Known Allergies  I personally reviewed active problem list, medication list, allergies, family history, social history, health maintenance with the patient/caregiver today.   ROS  Constitutional: Negative for fever or weight change.  Respiratory: Negative for cough and shortness of breath.   Cardiovascular: Negative for chest pain or palpitations.  Gastrointestinal: Negative for abdominal pain, no bowel  changes.  Musculoskeletal: Negative for gait problem or joint swelling.  Skin: Negative for rash.  Neurological: Negative for dizziness or headache.  No other specific complaints in a complete review of systems (except as listed in HPI above).  Objective  Vitals:   05/20/18 0819  BP: (!) 158/78  Pulse: 93  Resp: 16  Temp: 97.6 F (36.4 C)  TempSrc: Oral  SpO2: 98%  Weight: 201 lb 12.8 oz (91.5 kg)  Height: 5\' 4"  (1.626 m)    Body mass index is 34.64 kg/m.  Physical Exam  Constitutional: Patient appears well-developed and well-nourished. Obese  No distress.  HEENT: head atraumatic, normocephalic, pupils equal and reactive to light, neck supple, throat within normal limits Cardiovascular: Normal rate, regular rhythm and normal heart sounds.  No murmur heard. No BLE edema. Pulmonary/Chest: Effort normal and breath sounds normal. No respiratory distress. Abdominal: Soft.  There is no tenderness. Psychiatric: Patient has a normal mood and affect. behavior is normal. Judgment and thought content normal.  Recent Results (from the past 2160 hour(s))  TSH     Status: Abnormal   Collection Time: 05/18/18 10:05 AM  Result Value Ref Range   TSH 0.27 (L) 0.40 - 4.50 mIU/L     PHQ2/9: Depression screen [facility] 2/9 05/20/2018 01/04/2018 11/18/2017 07/14/2017 04/17/2017  Decreased Interest 0 0 0 0 0  Down, Depressed, Hopeless 0 0 0 0 0  PHQ - 2 Score 0 0 0 0 0  Altered sleeping 0 0 0 - -  Tired, decreased  energy 0 1 1 - -  Change in appetite 0 0 0 - -  Feeling bad or failure about yourself  0 0 0 - -  Trouble concentrating 0 0 0 - -  Moving slowly or fidgety/restless 0 0 0 - -  Suicidal thoughts 0 0 0 - -  PHQ-9 Score 0 1 1 - -  Difficult doing work/chores Not difficult at all Not difficult at all Not difficult at all - -     Fall Risk: Fall Risk  05/20/2018 01/04/2018 11/18/2017 07/14/2017 04/17/2017  Falls in the past year? No Yes Yes Yes No  Number falls in past yr: - 1 1 1  -  Comment - - Fell off a step ladder - -  Injury with Fall? - No No Yes -     Functional Status Survey: Is the patient deaf or have difficulty hearing?: No Does the patient have difficulty seeing, even when wearing glasses/contacts?: No Does the patient have difficulty concentrating, remembering, or making decisions?: No Does the patient have difficulty walking or climbing stairs?: No Does the patient have difficulty dressing or bathing?: No Does the patient have difficulty doing errands alone such as visiting a doctor's office or shopping?: No   Assessment & Plan  1. Dysuria  - CULTURE, URINE COMPREHENSIVE - Cipro 250 mg BID #6  2. History of recurrent UTIs  - POCT urinalysis dipstick - CULTURE, URINE COMPREHENSIVE  3. Need for influenza vaccination  - Flu Vaccine QUAD 6+ mos PF IM (Fluarix Quad PF)  4. Need for Tdap vaccination  Out of stock   5. OSA (obstructive sleep apnea)  Wearing CPAP every night   6. Adult hypothyroidism  - TSH  7. B12 deficiency  - Vitamin B12  8. Hypertension, benign  Discussed possible side effects of medication  - CBC with Differential/Platelet - COMPLETE METABOLIC PANEL WITH GFR - lisinopril (PRINIVIL,ZESTRIL) 10 MG tablet; Take 1 tablet (10 mg total) by mouth daily.  Dispense: 30 tablet; Refill: 2  9. Dyslipidemia  - Lipid panel  10. Hyperglycemia  - Hemoglobin A1c  11. Vitamin D deficiency  - VITAMIN D 25 Hydroxy (Vit-D Deficiency,  Fractures)

## 2018-05-22 LAB — CULTURE, URINE COMPREHENSIVE
MICRO NUMBER: 91280710
SPECIMEN QUALITY:: ADEQUATE

## 2018-06-15 ENCOUNTER — Ambulatory Visit: Payer: Self-pay | Admitting: *Deleted

## 2018-06-15 NOTE — Telephone Encounter (Signed)
Patient was informed of Dr. Sowles message and said thanks. 

## 2018-06-15 NOTE — Telephone Encounter (Signed)
She can take 88 mcg one day and half pill the other day until she returns

## 2018-06-15 NOTE — Telephone Encounter (Signed)
Pt is taking synthroid 75 mcg and only has 2 left. She is out of town and wants to know if she could skip the one dose on Friday or take a 88 mcg for one dose. She still has the 88 mcg that she had previously been on. She will be back in town on Saturday. But she will be out of her synthroid on Friday. She is requesting a call back from her pcp today please. Reason for Disposition . Caller has URGENT medication question about med that PCP prescribed and triager unable to answer question  Protocols used: MEDICATION QUESTION CALL-A-AH

## 2018-06-15 NOTE — Telephone Encounter (Signed)
Please advise 

## 2018-06-23 ENCOUNTER — Ambulatory Visit (INDEPENDENT_AMBULATORY_CARE_PROVIDER_SITE_OTHER): Payer: BLUE CROSS/BLUE SHIELD

## 2018-06-23 DIAGNOSIS — E538 Deficiency of other specified B group vitamins: Secondary | ICD-10-CM

## 2018-06-23 DIAGNOSIS — D518 Other vitamin B12 deficiency anemias: Secondary | ICD-10-CM | POA: Diagnosis not present

## 2018-06-23 DIAGNOSIS — R399 Unspecified symptoms and signs involving the genitourinary system: Secondary | ICD-10-CM

## 2018-06-23 NOTE — Addendum Note (Signed)
Addended by: Tommie RaymondBOOKER, Danyela Posas L on: 06/23/2018 10:29 AM   Modules accepted: Orders

## 2018-06-23 NOTE — Progress Notes (Addendum)
Patient came in for her B-12 injection. She tolerated it well. NKDA.  Patient thinks that she has a UTI. She has been having symptoms which include urgency and frequency x 1 day. She left a urine culture during her nurse visit.

## 2018-06-24 LAB — URINE CULTURE
MICRO NUMBER: 91432891
SPECIMEN QUALITY: ADEQUATE

## 2018-07-06 ENCOUNTER — Encounter: Payer: Self-pay | Admitting: Family Medicine

## 2018-07-06 ENCOUNTER — Ambulatory Visit: Payer: BLUE CROSS/BLUE SHIELD | Admitting: Family Medicine

## 2018-07-06 VITALS — BP 124/70 | HR 90 | Temp 97.6°F | Resp 16 | Ht 64.0 in | Wt 197.3 lb

## 2018-07-06 DIAGNOSIS — Z1159 Encounter for screening for other viral diseases: Secondary | ICD-10-CM | POA: Diagnosis not present

## 2018-07-06 DIAGNOSIS — Z23 Encounter for immunization: Secondary | ICD-10-CM | POA: Diagnosis not present

## 2018-07-06 DIAGNOSIS — G4733 Obstructive sleep apnea (adult) (pediatric): Secondary | ICD-10-CM

## 2018-07-06 DIAGNOSIS — E66811 Obesity, class 1: Secondary | ICD-10-CM

## 2018-07-06 DIAGNOSIS — I10 Essential (primary) hypertension: Secondary | ICD-10-CM

## 2018-07-06 DIAGNOSIS — E039 Hypothyroidism, unspecified: Secondary | ICD-10-CM | POA: Diagnosis not present

## 2018-07-06 DIAGNOSIS — E669 Obesity, unspecified: Secondary | ICD-10-CM

## 2018-07-06 DIAGNOSIS — E559 Vitamin D deficiency, unspecified: Secondary | ICD-10-CM

## 2018-07-06 DIAGNOSIS — E785 Hyperlipidemia, unspecified: Secondary | ICD-10-CM

## 2018-07-06 MED ORDER — LEVOTHYROXINE SODIUM 75 MCG PO TABS: 75.0000 ug | ORAL_TABLET | Freq: Every day | ORAL | 1 refills | Status: DC

## 2018-07-06 NOTE — Patient Instructions (Signed)
Return in 2 months for shingrix Return in 6 months for hepatitis A

## 2018-07-06 NOTE — Progress Notes (Signed)
Name: Claudia Norris   MRN: [id number]    DOB: Jul 09, 1956   Date:07/06/2018       Progress Note  Subjective  Chief Complaint  Chief Complaint  Patient presents with  . Hypertension  . Hyperglycemia  . Hyperlipidemia    HPI  Recurrent UTI's: she had two episodes in the past year, she will increase in water, and discussed topical estrogen if recurrence of symptoms  OSA: she has been compliant with CPAP since May 2019, diagnosed by Dr. Welton Flakes.Marland Kitchen  HTN: her bp was up, but she was under stress, bp is back to normal, she never took ACE , continue life style modifications.   B12: still getting B12 injections monthly, last b12 was at goal , current B12 is pending   Obesity: trying to walk, eating healthier, discussed portion control, she lost 2 lbs since last visit   Hypothyroidism: last TSH , was suppressed, on lower dose of levothyroxine 75 mcg daily and TSH reviewed today and at goal. She has intermittent dysphagia - but could be from GERD . She has dry skin .  Vitamin D deficiency: she has been taking rx vitamin D , reviewed labs and is at goal   GERD: under control with life style modification, takes Tums and prilosec prn, but doing very well with Activia yogurt. Unchanged   Chronic constipation: has bowel movements about twice a week, all her life, she has started eating apples and seems to be helping, she does not want medications at this time. Unchanged   Patient Active Problem List   Diagnosis Date Noted  . Left ventricular hypertrophy by electrocardiogram 01/07/2018  . Personal history of colonic polyps   . Family history of rectal cancer 02/29/2016  . Dyslipidemia 03/03/2015  . Gastro-esophageal reflux disease without esophagitis 03/03/2015  . History of cervical cancer 03/03/2015  . H/O: HTN (hypertension) 03/03/2015  . Blood glucose elevated 03/03/2015  . Adult hypothyroidism 03/03/2015  . Obesity (BMI 30.0-34.9) 03/03/2015  . Plantar fasciitis 03/03/2015  .  OSA (obstructive sleep apnea) 03/03/2015  . Closed dislocation of jaw 03/03/2015  . Fibromyalgia syndrome 10/04/2009    Past Surgical History:  Procedure Laterality Date  . ABDOMINAL HYSTERECTOMY    . CESAREAN SECTION    . COLON SURGERY  2009   removed some of colon  . COLONOSCOPY WITH PROPOFOL N/A 04/18/2016   Procedure: COLONOSCOPY WITH PROPOFOL;  Surgeon: Midge Minium, MD;  Location: [facility];  Service: Endoscopy;  Laterality: N/A;  sleep apnea    Family History  Problem Relation Age of Onset  . Hypertension Mother   . Hypothyroidism Mother   . Heart disease Mother        bypass  . Stroke Mother   . Cancer - Other Mother        rectal  . Arthritis Father   . Dementia Father   . Breast cancer Maternal Aunt        mat great aunt    Social History   Socioeconomic History  . Marital status: Married    Spouse name: [name]  . Number of children: 2  . Years of education: Not on file  . Highest education level: Some college, no degree  Occupational History  . Not on file  Social Needs  . Financial resource strain: Somewhat hard  . Food insecurity:    Worry: Never true    Inability: Never true  . Transportation needs:    Medical: No    Non-medical: No  Tobacco Use  . Smoking status: Never Smoker  . Smokeless tobacco: Never Used  Substance and Sexual Activity  . Alcohol use: No    Alcohol/week: 0.0 standard drinks  . Drug use: No  . Sexual activity: Yes    Partners: Male    Birth control/protection: Post-menopausal  Lifestyle  . Physical activity:    Days per week: 3 days    Minutes per session: 30 min  . Stress: Not at all  Relationships  . Social connections:    Talks on phone: More than three times a week    Gets together: Once a week    Attends religious service: More than 4 times per year    Active member of club or organization: Yes    Attends meetings of clubs or organizations: More than 4 times per year    Relationship status: Married  .  Intimate partner violence:    Fear of current or ex partner: No    Emotionally abused: No    Physically abused: No    Forced sexual activity: No  Other Topics Concern  . Not on file  Social History Narrative   Married. Has two grown children.    One finished law school the youngest lives with grandmother in [place] - helping her out.   She takes care of her mother and father that live in [place]      Her maternal aunt takes care of her mother in [place]      Dad has been recently put in a nursing home.     Current Outpatient Medications:  .  aspirin EC 81 MG tablet, Take 81 mg by mouth daily., Disp: , Rfl:  .  fluticasone (FLONASE) 50 MCG/ACT nasal spray, Place 2 sprays into both nostrils as needed., Disp: 16 g, Rfl: 11 .  levothyroxine (SYNTHROID, LEVOTHROID) 75 MCG tablet, Take 1 tablet (75 mcg total) by mouth daily., Disp: 30 tablet, Rfl: 1 .  lisinopril (PRINIVIL,ZESTRIL) 10 MG tablet, Take 1 tablet (10 mg total) by mouth daily., Disp: 30 tablet, Rfl: 2 .  Nutritional Supplements (JUICE PLUS FIBRE PO), Take by mouth., Disp: , Rfl:  .  Vitamin D, Ergocalciferol, (DRISDOL) 50000 units CAPS capsule, TAKE 1 CAPSULE (50,000 UNITS TOTAL) BY MOUTH EVERY 7 (SEVEN) DAYS., Disp: 12 capsule, Rfl: 2 .  ciprofloxacin (CIPRO) 250 MG tablet, Take 1 tablet (250 mg total) by mouth 2 (two) times daily. (Patient not taking: Reported on 07/06/2018), Disp: 6 tablet, Rfl: 0  Current Facility-Administered Medications:  .  cyanocobalamin ((VITAMIN B-12)) injection 1,000 mcg, 1,000 mcg, Subcutaneous, Q30 days, Alba Cory, MD, 1,000 mcg at 06/23/18 1021  No Known Allergies  I personally reviewed active problem list, medication list, allergies, family history, social history with the patient/caregiver today.   ROS  Constitutional: Negative for fever or weight change.  Respiratory: Negative for cough and shortness of breath.   Cardiovascular: Negative for chest pain or palpitations.   Gastrointestinal: Negative for abdominal pain, no bowel changes.  Musculoskeletal: Negative for gait problem or joint swelling.  Skin: Negative for rash.  Neurological: Negative for dizziness or headache.  No other specific complaints in a complete review of systems (except as listed in HPI above).  Objective  Vitals:   07/06/18 0905  BP: 124/70  Pulse: 90  Resp: 16  Temp: 97.6 F (36.4 C)  TempSrc: Oral  SpO2: 98%  Weight: 197 lb 4.8 oz (89.5 kg)  Height: 5\' 4"  (1.626 m)    Body  mass index is 33.87 kg/m.  Physical Exam  Constitutional: Patient appears well-developed and well-nourished. Obese  No distress.  HEENT: head atraumatic, normocephalic, pupils equal and reactive to light, neck supple, throat within normal limits Cardiovascular: Normal rate, regular rhythm and normal heart sounds.  No murmur heard. No BLE edema. Pulmonary/Chest: Effort normal and breath sounds normal. No respiratory distress. Abdominal: Soft.  There is no tenderness. Psychiatric: Patient has a normal mood and affect. behavior is normal. Judgment and thought content normal.  Recent Results (from the past 2160 hour(s))  TSH     Status: Abnormal   Collection Time: 05/18/18 10:05 AM  Result Value Ref Range   TSH 0.27 (L) 0.40 - 4.50 mIU/L  POCT urinalysis dipstick     Status: Abnormal   Collection Time: 05/20/18  8:51 AM  Result Value Ref Range   Color, UA YELLOW    Clarity, UA CLOUDY    Glucose, UA Negative Negative   Bilirubin, UA NEGATIVE    Ketones, UA NEGATIVE    Spec Grav, UA 1.015 1.010 - 1.025   Blood, UA hemolyzed trace    pH, UA 6.0 5.0 - 8.0   Protein, UA Negative Negative   Urobilinogen, UA negative (A) 0.2 or 1.0 E.U./dL   Nitrite, UA Positive    Leukocytes, UA Large (3+) (A) Negative   Appearance Cloudy    Odor Strong   CULTURE, URINE COMPREHENSIVE     Status: Abnormal   Collection Time: 05/20/18  9:00 AM  Result Value Ref Range   MICRO NUMBER: [id number]    SPECIMEN  QUALITY: ADEQUATE    Source OTHER (SPECIFY)    STATUS: FINAL    ISOLATE 1: Escherichia coli (A)     Comment: Greater than 100,000 CFU/mL of Escherichia coli      Susceptibility   Escherichia coli - CULT, URN, SPECIAL NEGATIVE 1    AMOX/CLAVULANIC 4 Sensitive     AMPICILLIN 8 Sensitive     AMPICILLIN/SULBACTAM 4 Sensitive     CEFAZOLIN* <=4 Not Reportable      * For infections other than uncomplicated UTIcaused by E. coli, K. pneumoniae or P. mirabilis:Cefazolin is resistant if MIC > or = 8 mcg/mL.(Distinguishing susceptible versus intermediatefor isolates with MIC < or = 4 mcg/mL requiresadditional testing.)For uncomplicated UTI caused by E. coli,K. pneumoniae or P. mirabilis: Cefazolin issusceptible if MIC <32 mcg/mL and predictssusceptible to the oral agents cefaclor, cefdinir,cefpodoxime, cefprozil, cefuroxime, cephalexinand loracarbef.    CEFEPIME <=1 Sensitive     CEFTRIAXONE <=1 Sensitive     CIPROFLOXACIN <=0.25 Sensitive     LEVOFLOXACIN <=0.12 Sensitive     ERTAPENEM <=0.5 Sensitive     GENTAMICIN <=1 Sensitive     IMIPENEM <=0.25 Sensitive     NITROFURANTOIN <=16 Sensitive     PIP/TAZO <=4 Sensitive     TOBRAMYCIN <=1 Sensitive     TRIMETH/SULFA* <=20 Sensitive      * For infections other than uncomplicated UTIcaused by E. coli, K. pneumoniae or P. mirabilis:Cefazolin is resistant if MIC > or = 8 mcg/mL.(Distinguishing susceptible versus intermediatefor isolates with MIC < or = 4 mcg/mL requiresadditional testing.)For uncomplicated UTI caused by E. coli,K. pneumoniae or P. mirabilis: Cefazolin issusceptible if MIC <32 mcg/mL and predictssusceptible to the oral agents cefaclor, cefdinir,cefpodoxime, cefprozil, cefuroxime, cephalexinand loracarbef.Legend:S = Susceptible  I = IntermediateR = Resistant  NS = Not susceptible* = Not tested  NR = Not reported**NN = See antimicrobic comments  Urine Culture     Status: None  Collection Time: 06/23/18 10:38 AM  Result Value Ref Range    MICRO NUMBER: [id number]    SPECIMEN QUALITY: Adequate    Sample Source URINE    STATUS: FINAL    ISOLATE 1:      Three or more organisms present, each greater than 10,000 CFU/mL. May represent normal flora contamination from external genitalia. No further testing is required.  Hemoglobin A1c     Status: None   Collection Time: 07/05/18 10:21 AM  Result Value Ref Range   Hgb A1c MFr Bld 5.3 <5.7 % of total Hgb    Comment: For the purpose of screening for the presence of diabetes: . <5.7%       Consistent with the absence of diabetes 5.7-6.4%    Consistent with increased risk for diabetes             (prediabetes) > or =6.5%  Consistent with diabetes . This assay result is consistent with a decreased risk of diabetes. . Currently, no consensus exists regarding use of hemoglobin A1c for diagnosis of diabetes in children. . According to American Diabetes Association (ADA) guidelines, hemoglobin A1c <7.0% represents optimal control in non-pregnant diabetic patients. Different metrics may apply to specific patient populations.  Standards of Medical Care in Diabetes(ADA). .    Mean Plasma Glucose 105 (calc)   eAG (mmol/L) 5.8 (calc)  Lipid panel     Status: Abnormal   Collection Time: 07/05/18 10:21 AM  Result Value Ref Range   Cholesterol 195 <200 mg/dL   HDL 57 >54>50 mg/dL   Triglycerides 098108 <119<150 mg/dL   LDL Cholesterol (Calc) 116 (H) mg/dL (calc)    Comment: Reference range: <100 . Desirable range <100 mg/dL for primary prevention;   <70 mg/dL for patients with CHD or diabetic patients  with > or = 2 CHD risk factors. Marland Kitchen. LDL-C is now calculated using the Martin-Hopkins  calculation, which is a validated novel method providing  better accuracy than the Friedewald equation in the  estimation of LDL-C.  Horald PollenMartin SS et al. Lenox AhrJAMA. 1478;295(622013;310(19): 2061-2068  (http://education.[facility].com/faq/FAQ164)    Total CHOL/HDL Ratio 3.4 <5.0 (calc)   Non-HDL Cholesterol (Calc) 138  (H) <130 mg/dL (calc)    Comment: For patients with diabetes plus 1 major ASCVD risk  factor, treating to a non-HDL-C goal of <100 mg/dL  (LDL-C of <13<70 mg/dL) is considered a therapeutic  option.   VITAMIN D 25 Hydroxy (Vit-D Deficiency, Fractures)     Status: None   Collection Time: 07/05/18 10:21 AM  Result Value Ref Range   Vit D, 25-Hydroxy 33 30 - 100 ng/mL    Comment: Vitamin D Status         25-OH Vitamin D: . Deficiency:                    <20 ng/mL Insufficiency:             20 - 29 ng/mL Optimal:                 > or = 30 ng/mL . For 25-OH Vitamin D testing on patients on  D2-supplementation and patients for whom quantitation  of D2 and D3 fractions is required, the QuestAssureD(TM) 25-OH VIT D, (D2,D3), LC/MS/MS is recommended: order  code [id number] (patients >5731yrs). . For more information on this test, go to: http://education.[facility].com/faq/FAQ163 (This link is being provided for  informational/educational purposes only.)   TSH     Status: None   Collection Time: 07/05/18  10:21 AM  Result Value Ref Range   TSH 2.36 0.40 - 4.50 mIU/L  CBC with Differential/Platelet     Status: None   Collection Time: 07/05/18 10:21 AM  Result Value Ref Range   WBC 6.0 3.8 - 10.8 Thousand/uL   RBC 4.67 3.80 - 5.10 Million/uL   Hemoglobin 13.7 11.7 - 15.5 g/dL   HCT 09.8 11.9 - 14.7 %   MCV 87.6 80.0 - 100.0 fL   MCH 29.3 27.0 - 33.0 pg   MCHC 33.5 32.0 - 36.0 g/dL   RDW 82.9 56.2 - 13.0 %   Platelets 294 140 - 400 Thousand/uL   MPV 10.6 7.5 - 12.5 fL   Neutro Abs 3,654 1,500 - 7,800 cells/uL   Lymphs Abs 1,698 850 - 3,900 cells/uL   WBC mixed population 480 200 - 950 cells/uL   Eosinophils Absolute 108 15 - 500 cells/uL   Basophils Absolute 60 0 - 200 cells/uL   Neutrophils Relative % 60.9 %   Total Lymphocyte 28.3 %   Monocytes Relative 8.0 %   Eosinophils Relative 1.8 %   Basophils Relative 1.0 %  COMPLETE METABOLIC PANEL WITH GFR     Status: None   Collection  Time: 07/05/18 10:21 AM  Result Value Ref Range   Glucose, Bld 91 65 - 99 mg/dL    Comment: .            Fasting reference interval .    BUN 14 7 - 25 mg/dL   Creat 8.65 7.84 - 6.96 mg/dL    Comment: For patients >38 years of age, the reference limit for Creatinine is approximately 13% higher for people identified as African-American. .    GFR, Est Non African American 80 > OR = 60 mL/min/1.30m2   GFR, Est African American 93 > OR = 60 mL/min/1.90m2   BUN/Creatinine Ratio NOT APPLICABLE 6 - 22 (calc)   Sodium 137 135 - 146 mmol/L   Potassium 4.6 3.5 - 5.3 mmol/L   Chloride 104 98 - 110 mmol/L   CO2 25 20 - 32 mmol/L   Calcium 9.3 8.6 - 10.4 mg/dL   Total Protein 6.8 6.1 - 8.1 g/dL   Albumin 4.4 3.6 - 5.1 g/dL   Globulin 2.4 1.9 - 3.7 g/dL (calc)   AG Ratio 1.8 1.0 - 2.5 (calc)   Total Bilirubin 0.4 0.2 - 1.2 mg/dL   Alkaline phosphatase (APISO) 80 33 - 130 U/L   AST 19 10 - 35 U/L   ALT 14 6 - 29 U/L     PHQ2/9: Depression screen [facility] 2/9 05/20/2018 01/04/2018 11/18/2017 07/14/2017 04/17/2017  Decreased Interest 0 0 0 0 0  Down, Depressed, Hopeless 0 0 0 0 0  PHQ - 2 Score 0 0 0 0 0  Altered sleeping 0 0 0 - -  Tired, decreased energy 0 1 1 - -  Change in appetite 0 0 0 - -  Feeling bad or failure about yourself  0 0 0 - -  Trouble concentrating 0 0 0 - -  Moving slowly or fidgety/restless 0 0 0 - -  Suicidal thoughts 0 0 0 - -  PHQ-9 Score 0 1 1 - -  Difficult doing work/chores Not difficult at all Not difficult at all Not difficult at all - -     Fall Risk: Fall Risk  07/06/2018 05/20/2018 01/04/2018 11/18/2017 07/14/2017  Falls in the past year? 0 No Yes Yes Yes  Number falls in past yr: - -  1 1 1   Comment - - - Larey Seat off a step ladder -  Injury with Fall? - - No No Yes    Assessment & Plan  1. OSA (obstructive sleep apnea)  Doing well on CPAP   2. Need for Tdap vaccination  - Tdap vaccine greater than or equal to 7yo IM  3. Need for shingles  vaccine  - Varicella-zoster vaccine IM (Shingrix)  4. Need for vaccination against hepatitis A  - Hepatitis A vaccine adult IM  5. Need for hepatitis B screening test  - Hepatitis B Surface AntiBODY  6. Adult hypothyroidism  TSH at goal, doing well   7. Hypertension, benign  Resolved with life style modification   8. Dyslipidemia  Doing well   9. Obesity (BMI 30.0-34.9)  Discussed with the patient the risk posed by an increased BMI. Discussed importance of portion control, calorie counting and at least 150 minutes of physical activity weekly. Avoid sweet beverages and drink more water. Eat at least 6 servings of fruit and vegetables daily   10. Vitamin D deficiency  Continue supplementation

## 2018-07-07 LAB — COMPLETE METABOLIC PANEL WITH GFR
AG Ratio: 1.8 (calc) (ref 1.0–2.5)
ALT: 14 U/L (ref 6–29)
AST: 19 U/L (ref 10–35)
Albumin: 4.4 g/dL (ref 3.6–5.1)
Alkaline phosphatase (APISO): 80 U/L (ref 33–130)
BUN: 14 mg/dL (ref 7–25)
CALCIUM: 9.3 mg/dL (ref 8.6–10.4)
CO2: 25 mmol/L (ref 20–32)
Chloride: 104 mmol/L (ref 98–110)
Creat: 0.79 mg/dL (ref 0.50–0.99)
GFR, EST NON AFRICAN AMERICAN: 80 mL/min/{1.73_m2} (ref >=60)
GFR, Est African American: 93 mL/min/{1.73_m2} (ref >=60)
GLUCOSE: 91 mg/dL (ref 65–99)
Globulin: 2.4 g/dL (calc) (ref 1.9–3.7)
Potassium: 4.6 mmol/L (ref 3.5–5.3)
Sodium: 137 mmol/L (ref 135–146)
TOTAL PROTEIN: 6.8 g/dL (ref 6.1–8.1)
Total Bilirubin: 0.4 mg/dL (ref 0.2–1.2)

## 2018-07-07 LAB — LIPID PANEL
CHOL/HDL RATIO: 3.4 (calc) (ref <5.0)
Cholesterol: 195 mg/dL (ref <200)
HDL: 57 mg/dL (ref >50)
LDL Cholesterol (Calc): 116 mg/dL (calc) — ABNORMAL HIGH
NON-HDL CHOLESTEROL (CALC): 138 mg/dL — AB (ref <130)
Triglycerides: 108 mg/dL (ref <150)

## 2018-07-07 LAB — CBC WITH DIFFERENTIAL/PLATELET
BASOS PCT: 1 %
Basophils Absolute: 60 cells/uL (ref 0–200)
EOS PCT: 1.8 %
Eosinophils Absolute: 108 cells/uL (ref 15–500)
HCT: 40.9 % (ref 35.0–45.0)
Hemoglobin: 13.7 g/dL (ref 11.7–15.5)
Lymphs Abs: 1698 cells/uL (ref 850–3900)
MCH: 29.3 pg (ref 27.0–33.0)
MCHC: 33.5 g/dL (ref 32.0–36.0)
MCV: 87.6 fL (ref 80.0–100.0)
MONOS PCT: 8 %
MPV: 10.6 fL (ref 7.5–12.5)
Neutro Abs: 3654 cells/uL (ref 1500–7800)
Neutrophils Relative %: 60.9 %
PLATELETS: 294 10*3/uL (ref 140–400)
RBC: 4.67 10*6/uL (ref 3.80–5.10)
RDW: 13 % (ref 11.0–15.0)
Total Lymphocyte: 28.3 %
WBC mixed population: 480 cells/uL (ref 200–950)
WBC: 6 10*3/uL (ref 3.8–10.8)

## 2018-07-07 LAB — TSH: TSH: 2.36 m[IU]/L (ref 0.40–4.50)

## 2018-07-07 LAB — TEST AUTHORIZATION

## 2018-07-07 LAB — HEMOGLOBIN A1C
EAG (MMOL/L): 5.8 (calc)
Hgb A1c MFr Bld: 5.3 % of total Hgb (ref <5.7)
Mean Plasma Glucose: 105 (calc)

## 2018-07-07 LAB — VITAMIN B12: Vitamin B-12: 605 pg/mL (ref 200–1100)

## 2018-07-07 LAB — VITAMIN D 25 HYDROXY (VIT D DEFICIENCY, FRACTURES): Vit D, 25-Hydroxy: 33 ng/mL (ref 30–100)

## 2018-07-07 LAB — HEPATITIS B SURFACE ANTIBODY,QUALITATIVE: Hep B S Ab: NONREACTIVE

## 2018-08-09 ENCOUNTER — Ambulatory Visit (INDEPENDENT_AMBULATORY_CARE_PROVIDER_SITE_OTHER): Payer: BLUE CROSS/BLUE SHIELD

## 2018-08-09 ENCOUNTER — Telehealth: Payer: Self-pay | Admitting: Family Medicine

## 2018-08-09 DIAGNOSIS — Z23 Encounter for immunization: Secondary | ICD-10-CM | POA: Diagnosis not present

## 2018-08-09 DIAGNOSIS — D518 Other vitamin B12 deficiency anemias: Secondary | ICD-10-CM

## 2018-08-09 NOTE — Telephone Encounter (Signed)
Patient had a question about new Hepatitis B shot she has to return in 2-4 weeks for this shot and Shingrix is 2 to 6 months after initial vaccine which was in December 2019.

## 2018-08-09 NOTE — Telephone Encounter (Signed)
Pt would like a call back in regards to what shots she needs and when she needs to get them done. She has to go out of town to tend to her parents and wants to be sure she isnt to early getting shots. Please advise and let me know if I need to R/S the 09/06/2018 appt.

## 2018-08-12 ENCOUNTER — Ambulatory Visit (INDEPENDENT_AMBULATORY_CARE_PROVIDER_SITE_OTHER): Payer: BLUE CROSS/BLUE SHIELD | Admitting: Family Medicine

## 2018-08-12 ENCOUNTER — Encounter: Payer: Self-pay | Admitting: Family Medicine

## 2018-08-12 VITALS — BP 132/78 | HR 78 | Temp 98.0°F | Resp 16 | Ht 64.0 in | Wt 198.5 lb

## 2018-08-12 DIAGNOSIS — N9089 Other specified noninflammatory disorders of vulva and perineum: Secondary | ICD-10-CM | POA: Diagnosis not present

## 2018-08-12 DIAGNOSIS — R350 Frequency of micturition: Secondary | ICD-10-CM

## 2018-08-12 DIAGNOSIS — N3289 Other specified disorders of bladder: Secondary | ICD-10-CM

## 2018-08-12 DIAGNOSIS — R102 Pelvic and perineal pain: Secondary | ICD-10-CM

## 2018-08-12 LAB — POCT URINALYSIS DIPSTICK
BILIRUBIN UA: NEGATIVE
Glucose, UA: NEGATIVE
KETONES UA: NEGATIVE
NITRITE UA: NEGATIVE
PROTEIN UA: NEGATIVE
Spec Grav, UA: 1.01 (ref 1.010–1.025)
Urobilinogen, UA: 0.2 E.U./dL
pH, UA: 5 (ref 5.0–8.0)

## 2018-08-12 NOTE — Progress Notes (Signed)
Name: Claudia Norris   MRN: [id number]    DOB: May 15, 1956   Date:08/12/2018       Progress Note  Subjective  Chief Complaint  Chief Complaint  Patient presents with  . Constipation    States her stool is very soft   . Urinary Frequency    Feels like she has to go constantly-will pee finish and still pee out a little afterwards  . Urinary Tract Infection    Recurrent infections in the past  . Bloated    HPI  Constipation: long history of constipation since childhood , she states last week Bristol scale was 3, she took Prune juice and had bloating and could hear bowel sounds but no pain. Since that day she has increased her water and has been taking Miralax and bowel movements area now is loose. She noticed some bloating last night and some abdominal discomfort. No blood in stools or mucus  UTI: she had an UTI confirmed by culture back in October, she was treated and repeat test was negative in November. She states about 10 days ago she noticed urinary frequency, even at night she has to get up every 2 days, she states rocking helps empty her bladder, no hematuria, but has some mucus on her urine, she has supra pubic pain. Negative for fever or chills. No nausea or vomiting. She has intermittent right upper back pain  Vulvar lesion: she noticed a tender spot on right vulva, noticed about one week ago.   Patient Active Problem List   Diagnosis Date Noted  . Left ventricular hypertrophy by electrocardiogram 01/07/2018  . Personal history of colonic polyps   . Family history of rectal cancer 02/29/2016  . Dyslipidemia 03/03/2015  . Gastro-esophageal reflux disease without esophagitis 03/03/2015  . History of cervical cancer 03/03/2015  . H/O: HTN (hypertension) 03/03/2015  . Blood glucose elevated 03/03/2015  . Adult hypothyroidism 03/03/2015  . Obesity (BMI 30.0-34.9) 03/03/2015  . Plantar fasciitis 03/03/2015  . OSA (obstructive sleep apnea) 03/03/2015  . Closed dislocation of  jaw 03/03/2015  . Fibromyalgia syndrome 10/04/2009    Past Surgical History:  Procedure Laterality Date  . ABDOMINAL HYSTERECTOMY    . CESAREAN SECTION    . COLON SURGERY  2009   removed some of colon  . COLONOSCOPY WITH PROPOFOL N/A 04/18/2016   Procedure: COLONOSCOPY WITH PROPOFOL;  Surgeon: Midge Miniumarren Wohl, MD;  [facility];  Service: Endoscopy;  Laterality: N/A;  sleep apnea    Family History  Problem Relation Age of Onset  . Hypertension Mother   . Hypothyroidism Mother   . Heart disease Mother        bypass  . Stroke Mother   . Cancer - Other Mother        rectal  . Arthritis Father   . Dementia Father   . Breast cancer Maternal Aunt        mat great aunt    Social History   Socioeconomic History  . Marital status: Married    Spouse name: Nedra HaiLee  . Number of children: 2  . Years of education: Not on file  . Highest education level: Some college, no degree  Occupational History  . Not on file  Social Needs  . Financial resource strain: Somewhat hard  . Food insecurity:    Worry: Never true    Inability: Never true  . Transportation needs:    Medical: No    Non-medical: No  Tobacco Use  . Smoking  status: Never Smoker  . Smokeless tobacco: Never Used  Substance and Sexual Activity  . Alcohol use: No    Alcohol/week: 0.0 standard drinks  . Drug use: No  . Sexual activity: Yes    Partners: Male    Birth control/protection: Post-menopausal  Lifestyle  . Physical activity:    Days per week: 3 days    Minutes per session: 30 min  . Stress: Not at all  Relationships  . Social connections:    Talks on phone: More than three times a week    Gets together: Once a week    Attends religious service: More than 4 times per year    Active member of club or organization: Yes    Attends meetings of clubs or organizations: More than 4 times per year    Relationship status: Married  . Intimate partner violence:    Fear of current or ex partner: No     Emotionally abused: No    Physically abused: No    Forced sexual activity: No  Other Topics Concern  . Not on file  Social History Narrative   Married. Has two grown children.    One finished law school the youngest lives with grandmother in [place] - helping her out.   She takes care of her mother and father that live in [place]      Her maternal aunt takes care of her mother in [place]      Dad has been recently put in a nursing home.     Current Outpatient Medications:  .  aspirin EC 81 MG tablet, Take 81 mg by mouth daily., Disp: , Rfl:  .  fluticasone (FLONASE) 50 MCG/ACT nasal spray, Place 2 sprays into both nostrils as needed., Disp: 16 g, Rfl: 11 .  levothyroxine (SYNTHROID, LEVOTHROID) 75 MCG tablet, Take 1 tablet (75 mcg total) by mouth daily., Disp: 90 tablet, Rfl: 1 .  Nutritional Supplements (JUICE PLUS FIBRE PO), Take by mouth., Disp: , Rfl:  .  Vitamin D, Ergocalciferol, (DRISDOL) 50000 units CAPS capsule, TAKE 1 CAPSULE (50,000 UNITS TOTAL) BY MOUTH EVERY 7 (SEVEN) DAYS., Disp: 12 capsule, Rfl: 2  Current Facility-Administered Medications:  .  cyanocobalamin ((VITAMIN B-12)) injection 1,000 mcg, 1,000 mcg, Subcutaneous, Q30 days, Alba Cory, MD, 1,000 mcg at 08/09/18 3007  No Known Allergies  I personally reviewed active problem list, medication list, allergies, family history, social history with the patient/caregiver today.   ROS  Ten systems reviewed and is negative except as mentioned in HPI   Objective  Vitals:   08/12/18 1055  BP: (!) 155/80  Pulse: 78  Resp: 16  Temp: 98 F (36.7 C)  TempSrc: Oral  SpO2: 99%  Weight: 198 lb 8 oz (90 kg)  Height: 5\' 4"  (1.626 m)    Body mass index is 34.07 kg/m.  Physical Exam  Constitutional: Patient appears well-developed and well-nourished. Obese  No distress.  HEENT: head atraumatic, normocephalic, pupils equal and reactive to light,  neck supple, throat within normal limits Cardiovascular:  Normal rate, regular rhythm and normal heart sounds.  No murmur heard. No BLE edema. Pulmonary/Chest: Effort normal and breath sounds normal. No respiratory distress. Abdominal: Soft.  There is tenderness on supra pubic area, bladder palpable. Gyn: pea size growth on right vulva, top of the lesion is erythematous and tender to touch, bimanual exam showed tenderness. Rectal exam very small amount of stools on glove, brown in color, hemoccult negative, normal rectal tonus and not  impacted Psychiatric: Patient has a normal mood and affect. behavior is normal. Judgment and thought content normal.  Recent Results (from the past 2160 hour(s))  TSH     Status: Abnormal   Collection Time: 05/18/18 10:05 AM  Result Value Ref Range   TSH 0.27 (L) 0.40 - 4.50 mIU/L  POCT urinalysis dipstick     Status: Abnormal   Collection Time: 05/20/18  8:51 AM  Result Value Ref Range   Color, UA YELLOW    Clarity, UA CLOUDY    Glucose, UA Negative Negative   Bilirubin, UA NEGATIVE    Ketones, UA NEGATIVE    Spec Grav, UA 1.015 1.010 - 1.025   Blood, UA hemolyzed trace    pH, UA 6.0 5.0 - 8.0   Protein, UA Negative Negative   Urobilinogen, UA negative (A) 0.2 or 1.0 E.U./dL   Nitrite, UA Positive    Leukocytes, UA Large (3+) (A) Negative   Appearance Cloudy    Odor Strong   CULTURE, URINE COMPREHENSIVE     Status: Abnormal   Collection Time: 05/20/18  9:00 AM  Result Value Ref Range   MICRO NUMBER: [id number]    SPECIMEN QUALITY: ADEQUATE    Source OTHER (SPECIFY)    STATUS: FINAL    ISOLATE 1: Escherichia coli (A)     Comment: Greater than 100,000 CFU/mL of Escherichia coli      Susceptibility   Escherichia coli - CULT, URN, SPECIAL NEGATIVE 1    AMOX/CLAVULANIC 4 Sensitive     AMPICILLIN 8 Sensitive     AMPICILLIN/SULBACTAM 4 Sensitive     CEFAZOLIN* <=4 Not Reportable      * For infections other than uncomplicated UTIcaused by E. coli, K. pneumoniae or P. mirabilis:Cefazolin is resistant if MIC >  or = 8 mcg/mL.(Distinguishing susceptible versus intermediatefor isolates with MIC < or = 4 mcg/mL requiresadditional testing.)For uncomplicated UTI caused by E. coli,K. pneumoniae or P. mirabilis: Cefazolin issusceptible if MIC <32 mcg/mL and predictssusceptible to the oral agents cefaclor, cefdinir,cefpodoxime, cefprozil, cefuroxime, cephalexinand loracarbef.    CEFEPIME <=1 Sensitive     CEFTRIAXONE <=1 Sensitive     CIPROFLOXACIN <=0.25 Sensitive     LEVOFLOXACIN <=0.12 Sensitive     ERTAPENEM <=0.5 Sensitive     GENTAMICIN <=1 Sensitive     IMIPENEM <=0.25 Sensitive     NITROFURANTOIN <=16 Sensitive     PIP/TAZO <=4 Sensitive     TOBRAMYCIN <=1 Sensitive     TRIMETH/SULFA* <=20 Sensitive      * For infections other than uncomplicated UTIcaused by E. coli, K. pneumoniae or P. mirabilis:Cefazolin is resistant if MIC > or = 8 mcg/mL.(Distinguishing susceptible versus intermediatefor isolates with MIC < or = 4 mcg/mL requiresadditional testing.)For uncomplicated UTI caused by E. coli,K. pneumoniae or P. mirabilis: Cefazolin issusceptible if MIC <32 mcg/mL and predictssusceptible to the oral agents cefaclor, cefdinir,cefpodoxime, cefprozil, cefuroxime, cephalexinand loracarbef.Legend:S = Susceptible  I = IntermediateR = Resistant  NS = Not susceptible* = Not tested  NR = Not reported**NN = See antimicrobic comments  Urine Culture     Status: None   Collection Time: 06/23/18 10:38 AM  Result Value Ref Range   MICRO NUMBER: [id number]    SPECIMEN QUALITY: Adequate    Sample Source URINE    STATUS: FINAL    ISOLATE 1:      Three or more organisms present, each greater than 10,000 CFU/mL. May represent normal flora contamination from external genitalia. No further testing is required.  Hemoglobin  A1c     Status: None   Collection Time: 07/05/18 10:21 AM  Result Value Ref Range   Hgb A1c MFr Bld 5.3 <5.7 % of total Hgb    Comment: For the purpose of screening for the presence  of diabetes: . <5.7%       Consistent with the absence of diabetes 5.7-6.4%    Consistent with increased risk for diabetes             (prediabetes) > or =6.5%  Consistent with diabetes . This assay result is consistent with a decreased risk of diabetes. . Currently, no consensus exists regarding use of hemoglobin A1c for diagnosis of diabetes in children. . According to American Diabetes Association (ADA) guidelines, hemoglobin A1c <7.0% represents optimal control in non-pregnant diabetic patients. Different metrics may apply to specific patient populations.  Standards of Medical Care in Diabetes(ADA). .    Mean Plasma Glucose 105 (calc)   eAG (mmol/L) 5.8 (calc)  Lipid panel     Status: Abnormal   Collection Time: 07/05/18 10:21 AM  Result Value Ref Range   Cholesterol 195 <200 mg/dL   HDL 57 >35 mg/dL   Triglycerides 361 <443 mg/dL   LDL Cholesterol (Calc) 116 (H) mg/dL (calc)    Comment: Reference range: <100 . Desirable range <100 mg/dL for primary prevention;   <70 mg/dL for patients with CHD or diabetic patients  with > or = 2 CHD risk factors. Marland Kitchen LDL-C is now calculated using the Martin-Hopkins  calculation, which is a validated novel method providing  better accuracy than the Friedewald equation in the  estimation of LDL-C.  Horald Pollen et al. Lenox Ahr. 1540;086(76): 2061-2068  (http://education.[facility].com/faq/FAQ164)    Total CHOL/HDL Ratio 3.4 <5.0 (calc)   Non-HDL Cholesterol (Calc) 138 (H) <130 mg/dL (calc)    Comment: For patients with diabetes plus 1 major ASCVD risk  factor, treating to a non-HDL-C goal of <100 mg/dL  (LDL-C of <19 mg/dL) is considered a therapeutic  option.   Vitamin B12     Status: None   Collection Time: 07/05/18 10:21 AM  Result Value Ref Range   Vitamin B-12 605 200 - 1,100 pg/mL  VITAMIN D 25 Hydroxy (Vit-D Deficiency, Fractures)     Status: None   Collection Time: 07/05/18 10:21 AM  Result Value Ref Range   Vit D,  25-Hydroxy 33 30 - 100 ng/mL    Comment: Vitamin D Status         25-OH Vitamin D: . Deficiency:                    <20 ng/mL Insufficiency:             20 - 29 ng/mL Optimal:                 > or = 30 ng/mL . For 25-OH Vitamin D testing on patients on  D2-supplementation and patients for whom quantitation  of D2 and D3 fractions is required, the [facility]ureD(TM) 25-OH VIT D, (D2,D3), LC/MS/MS is recommended: order  code 50932 (patients >16yrs). . For more information on this test, go to: http://education.[facility].com/faq/FAQ163 (This link is being provided for  informational/educational purposes only.)   TSH     Status: None   Collection Time: 07/05/18 10:21 AM  Result Value Ref Range   TSH 2.36 0.40 - 4.50 mIU/L  CBC with Differential/Platelet     Status: None   Collection Time: 07/05/18 10:21 AM  Result Value Ref Range  WBC 6.0 3.8 - 10.8 Thousand/uL   RBC 4.67 3.80 - 5.10 Million/uL   Hemoglobin 13.7 11.7 - 15.5 g/dL   HCT 23.7 62.8 - 31.5 %   MCV 87.6 80.0 - 100.0 fL   MCH 29.3 27.0 - 33.0 pg   MCHC 33.5 32.0 - 36.0 g/dL   RDW 17.6 16.0 - 73.7 %   Platelets 294 140 - 400 Thousand/uL   MPV 10.6 7.5 - 12.5 fL   Neutro Abs 3,654 1,500 - 7,800 cells/uL   Lymphs Abs 1,698 850 - 3,900 cells/uL   WBC mixed population 480 200 - 950 cells/uL   Eosinophils Absolute 108 15 - 500 cells/uL   Basophils Absolute 60 0 - 200 cells/uL   Neutrophils Relative % 60.9 %   Total Lymphocyte 28.3 %   Monocytes Relative 8.0 %   Eosinophils Relative 1.8 %   Basophils Relative 1.0 %  COMPLETE METABOLIC PANEL WITH GFR     Status: None   Collection Time: 07/05/18 10:21 AM  Result Value Ref Range   Glucose, Bld 91 65 - 99 mg/dL    Comment: .            Fasting reference interval .    BUN 14 7 - 25 mg/dL   Creat 1.06 2.69 - 4.85 mg/dL    Comment: For patients >62 years of age, the reference limit for Creatinine is approximately 13% higher for people identified as  African-American. .    GFR, Est Non African American 80 > OR = 60 mL/min/1.87m2   GFR, Est African American 93 > OR = 60 mL/min/1.72m2   BUN/Creatinine Ratio NOT APPLICABLE 6 - 22 (calc)   Sodium 137 135 - 146 mmol/L   Potassium 4.6 3.5 - 5.3 mmol/L   Chloride 104 98 - 110 mmol/L   CO2 25 20 - 32 mmol/L   Calcium 9.3 8.6 - 10.4 mg/dL   Total Protein 6.8 6.1 - 8.1 g/dL   Albumin 4.4 3.6 - 5.1 g/dL   Globulin 2.4 1.9 - 3.7 g/dL (calc)   AG Ratio 1.8 1.0 - 2.5 (calc)   Total Bilirubin 0.4 0.2 - 1.2 mg/dL   Alkaline phosphatase (APISO) 80 33 - 130 U/L   AST 19 10 - 35 U/L   ALT 14 6 - 29 U/L  Hepatitis B surface antibody,qualitative     Status: None   Collection Time: 07/05/18 10:21 AM  Result Value Ref Range   Hep B S Ab NON-REACTIVE NON-REACTI  TEST AUTHORIZATION     Status: None   Collection Time: 07/05/18 10:21 AM  Result Value Ref Range   TEST NAME: HEPATITIS B SURFACE    TEST CODE: [id number]    CLIENT CONTACT: Tyce Delcid    REPORT ALWAYS MESSAGE SIGNATURE      Comment: . The laboratory testing on this patient was verbally requested or confirmed by the ordering physician or his or her authorized representative after contact with an employee of [employer]. Federal regulations require that we maintain on file written authorization for all laboratory testing.  Accordingly we are asking that the ordering physician or his or her authorized representative sign a copy of this report and promptly return it to the client service representative. . . Signature:____________________________________________________ . Please fax this signed page to [phone] or return it via your [employer] courier.   POCT urinalysis dipstick     Status: Abnormal   Collection Time: 08/12/18 10:51 AM  Result Value Ref Range   Color,  UA light yellow    Clarity, UA cloudy    Glucose, UA Negative Negative   Bilirubin, UA neg    Ketones, UA neg    Spec Grav, UA 1.010 1.010 -  1.025   Blood, UA Moderate    pH, UA 5.0 5.0 - 8.0   Protein, UA Negative Negative   Urobilinogen, UA 0.2 0.2 or 1.0 E.U./dL   Nitrite, UA neg    Leukocytes, UA Large (3+) (A) Negative   Appearance     Odor       PHQ2/9: Depression screen [facility] 2/9 05/20/2018 01/04/2018 11/18/2017 07/14/2017 04/17/2017  Decreased Interest 0 0 0 0 0  Down, Depressed, Hopeless 0 0 0 0 0  PHQ - 2 Score 0 0 0 0 0  Altered sleeping 0 0 0 - -  Tired, decreased energy 0 1 1 - -  Change in appetite 0 0 0 - -  Feeling bad or failure about yourself  0 0 0 - -  Trouble concentrating 0 0 0 - -  Moving slowly or fidgety/restless 0 0 0 - -  Suicidal thoughts 0 0 0 - -  PHQ-9 Score 0 1 1 - -  Difficult doing work/chores Not difficult at all Not difficult at all Not difficult at all - -     Fall Risk: Fall Risk  07/06/2018 05/20/2018 01/04/2018 11/18/2017 07/14/2017  Falls in the past year? 0 No Yes Yes Yes  Number falls in past yr: - - 1 1 1   Comment - - - Fell off a step ladder -  Injury with Fall? - - No No Yes     Functional Status Survey: Is the patient deaf or have difficulty hearing?: No Does the patient have difficulty seeing, even when wearing glasses/contacts?: No Does the patient have difficulty concentrating, remembering, or making decisions?: No Does the patient have difficulty walking or climbing stairs?: No Does the patient have difficulty dressing or bathing?: No Does the patient have difficulty doing errands alone such as visiting a doctor's office or shopping?: No    Assessment & Plan  1. Urinary frequency  - POCT urinalysis dipstick - Urine Culture - Ambulatory referral to Urology  2. Vaginal pain  - POCT urinalysis dipstick  3. Vulval lesion  - Ambulatory referral to Urology  4. Bladder distended  - Ambulatory referral to Urology

## 2018-08-13 ENCOUNTER — Telehealth: Payer: Self-pay | Admitting: Family Medicine

## 2018-08-13 LAB — URINE CULTURE
MICRO NUMBER: 66214
SPECIMEN QUALITY: ADEQUATE

## 2018-08-13 NOTE — Telephone Encounter (Signed)
Copied from CRM [id number]. Topic: Quick Communication - See Telephone Encounter >> Aug 13, 2018 10:56 AM Baldo Daub L wrote: CRM for notification. See Telephone encounter for: 08/13/18.  Pt states at [facility] yesterday Tiffany told her that she had some sort of urinary infection and she was going to check with Dr. Carlynn Purl to see what could be called in for her.  Pt never heard anything back and wants to know if she does in fact need to be put on medication. Pt uses [id number], [place] - [address] [phone] (Phone)  [phone] (Fax) Pt can be reached at [phone]

## 2018-08-13 NOTE — Telephone Encounter (Signed)
Spoke with patient and informed her Dr. Carlynn Purl does not want to start her on an antibiotic. She wants patient to be seen and treated by Urology. Spoke with Saverio Danker our referral coordinator and was able to have her seen today at [facility] in [place] at 1:45 p.m. versus her having to wait at [facility] until February 10.

## 2018-08-15 ENCOUNTER — Other Ambulatory Visit: Payer: Self-pay | Admitting: Family Medicine

## 2018-08-15 MED ORDER — AMOXICILLIN 500 MG PO CAPS
500.0000 mg | ORAL_CAPSULE | Freq: Two times a day (BID) | ORAL | 0 refills | Status: DC
Start: 2018-08-15 — End: 2019-01-12

## 2018-08-23 ENCOUNTER — Ambulatory Visit (INDEPENDENT_AMBULATORY_CARE_PROVIDER_SITE_OTHER): Payer: BLUE CROSS/BLUE SHIELD

## 2018-08-23 DIAGNOSIS — Z23 Encounter for immunization: Secondary | ICD-10-CM | POA: Diagnosis not present

## 2018-09-02 NOTE — Telephone Encounter (Signed)
error 

## 2018-09-06 ENCOUNTER — Ambulatory Visit: Payer: BLUE CROSS/BLUE SHIELD

## 2018-09-06 ENCOUNTER — Ambulatory Visit: Payer: BLUE CROSS/BLUE SHIELD | Admitting: Urology

## 2018-10-14 ENCOUNTER — Other Ambulatory Visit: Payer: Self-pay

## 2018-10-14 ENCOUNTER — Ambulatory Visit (INDEPENDENT_AMBULATORY_CARE_PROVIDER_SITE_OTHER): Payer: BLUE CROSS/BLUE SHIELD

## 2018-10-14 DIAGNOSIS — E538 Deficiency of other specified B group vitamins: Secondary | ICD-10-CM | POA: Diagnosis not present

## 2018-10-14 MED ORDER — CYANOCOBALAMIN 1000 MCG/ML IJ SOLN
1000.0000 ug | INTRAMUSCULAR | Status: DC
Start: 2018-10-14 — End: 2022-09-18
  Administered 2018-10-14 – 2019-07-11 (×7): 1000 ug via INTRAMUSCULAR

## 2018-10-14 NOTE — Progress Notes (Signed)
Patient came in for her B-12 injection. She tolerated it well. NKDA.   

## 2018-11-26 ENCOUNTER — Ambulatory Visit (INDEPENDENT_AMBULATORY_CARE_PROVIDER_SITE_OTHER): Payer: BLUE CROSS/BLUE SHIELD

## 2018-11-26 ENCOUNTER — Other Ambulatory Visit: Payer: Self-pay

## 2018-11-26 DIAGNOSIS — E538 Deficiency of other specified B group vitamins: Secondary | ICD-10-CM | POA: Diagnosis not present

## 2018-11-26 NOTE — Progress Notes (Signed)
Patient came in for her B-12 injection. She tolerated it well. NKDA.   

## 2018-12-05 IMAGING — MG MM DIGITAL SCREENING BILAT W/ CAD
4 series · 4 of 4 positions shown · non-contrast
Comparison: Previous exam(s).

CLINICAL DATA: Screening.

EXAM:
DIGITAL SCREENING BILATERAL MAMMOGRAM WITH CAD

[R MLO]
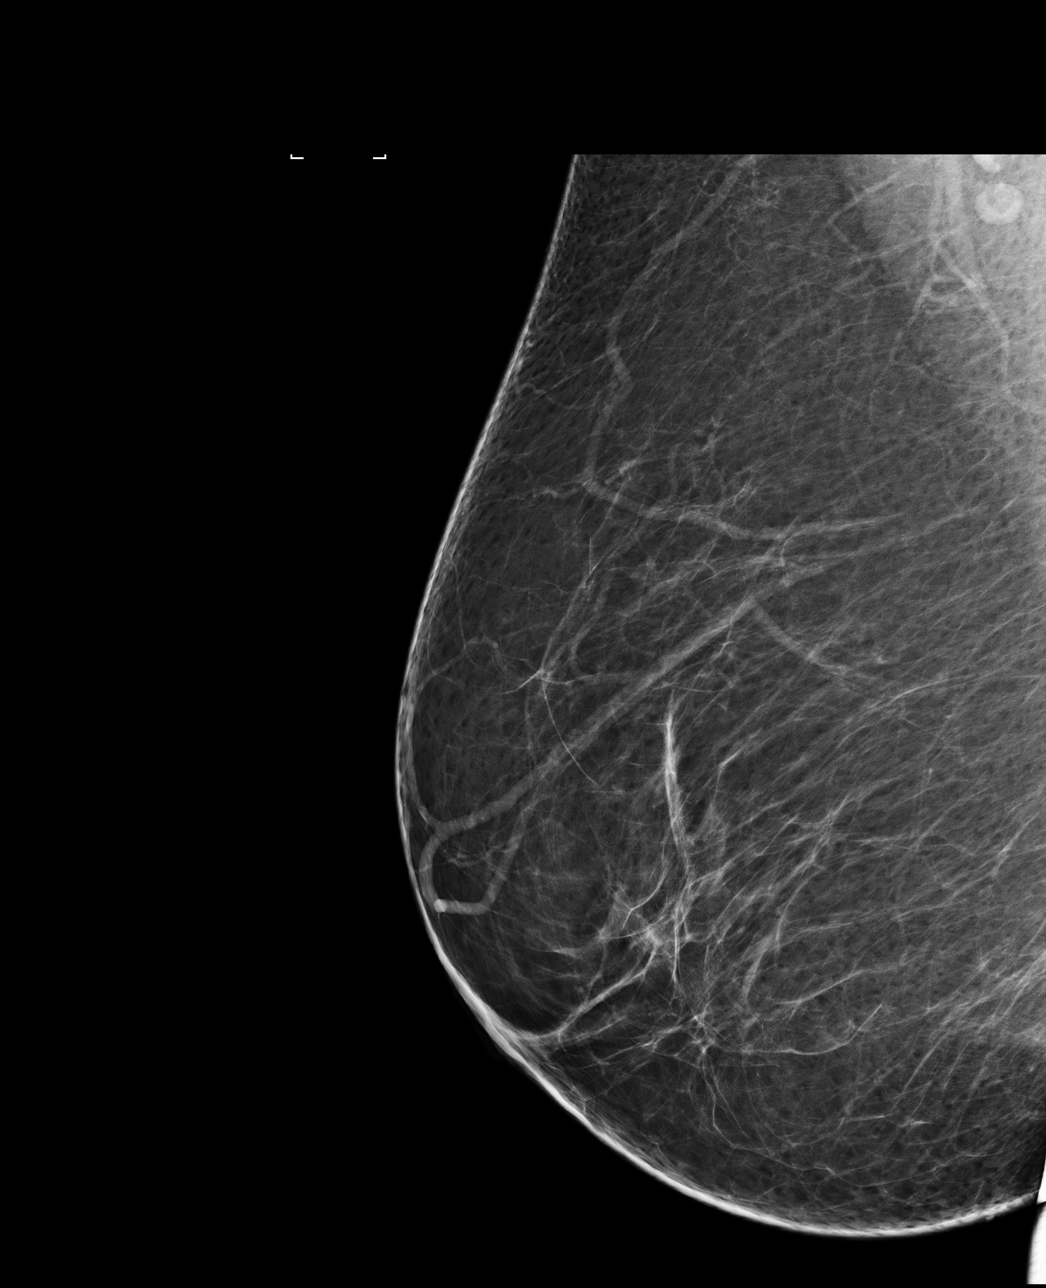

[L CC]
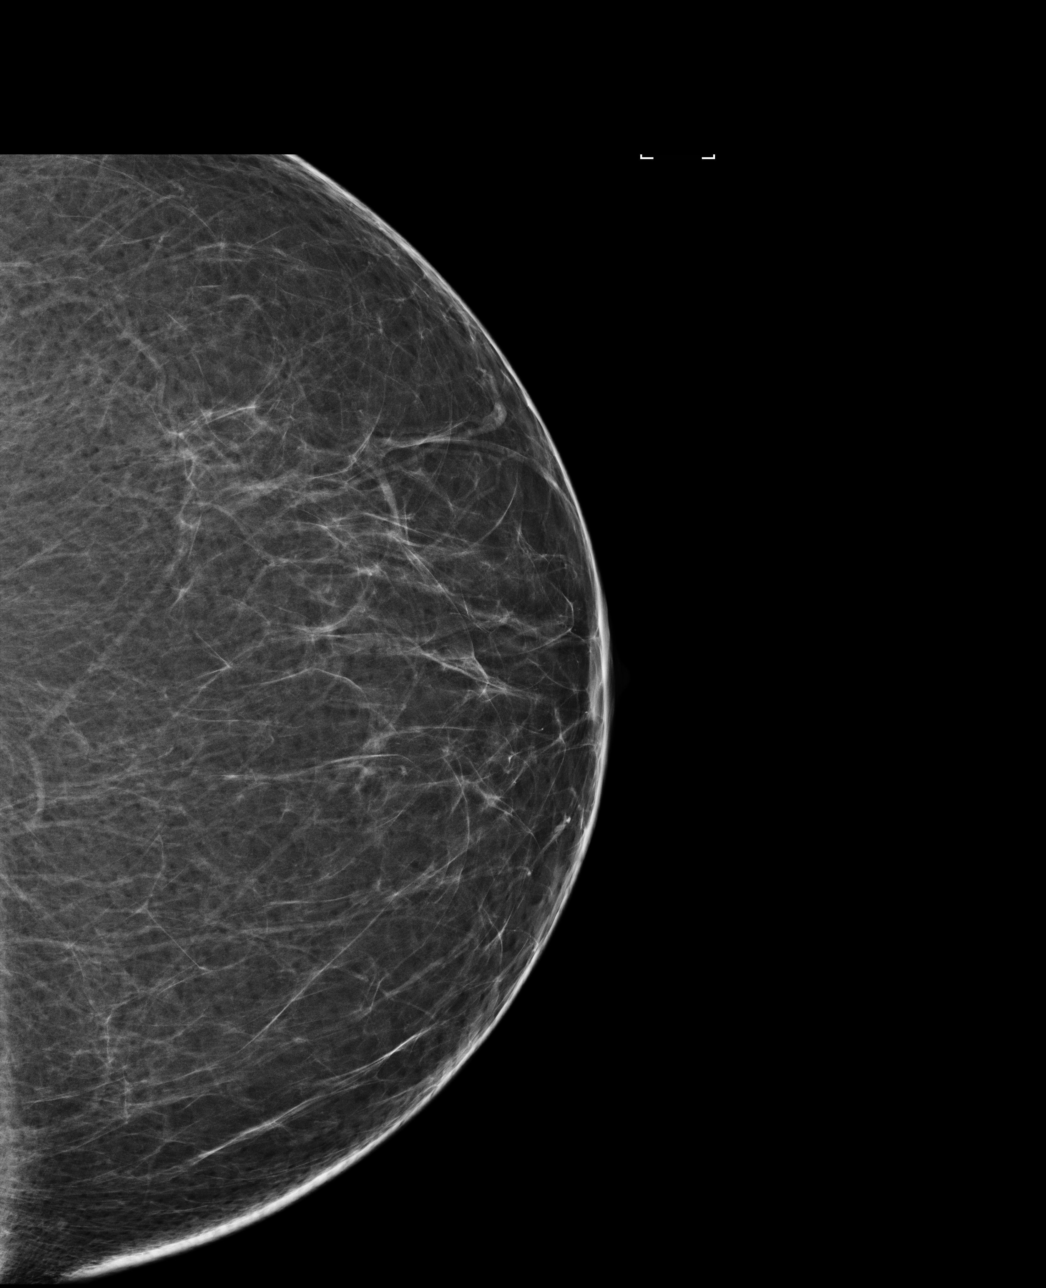

[R CC]
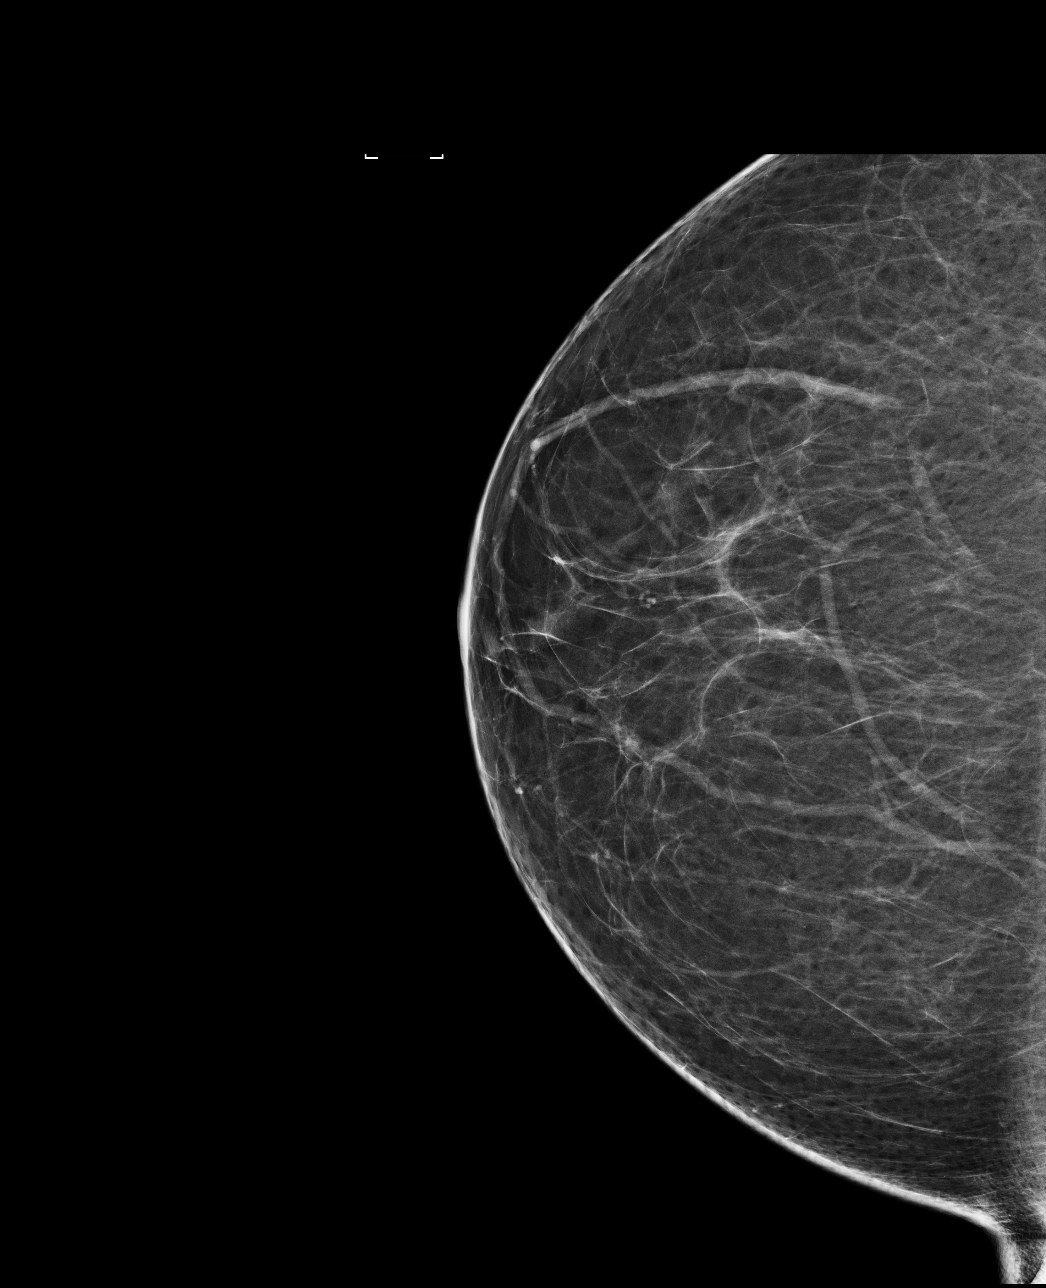

[L MLO]
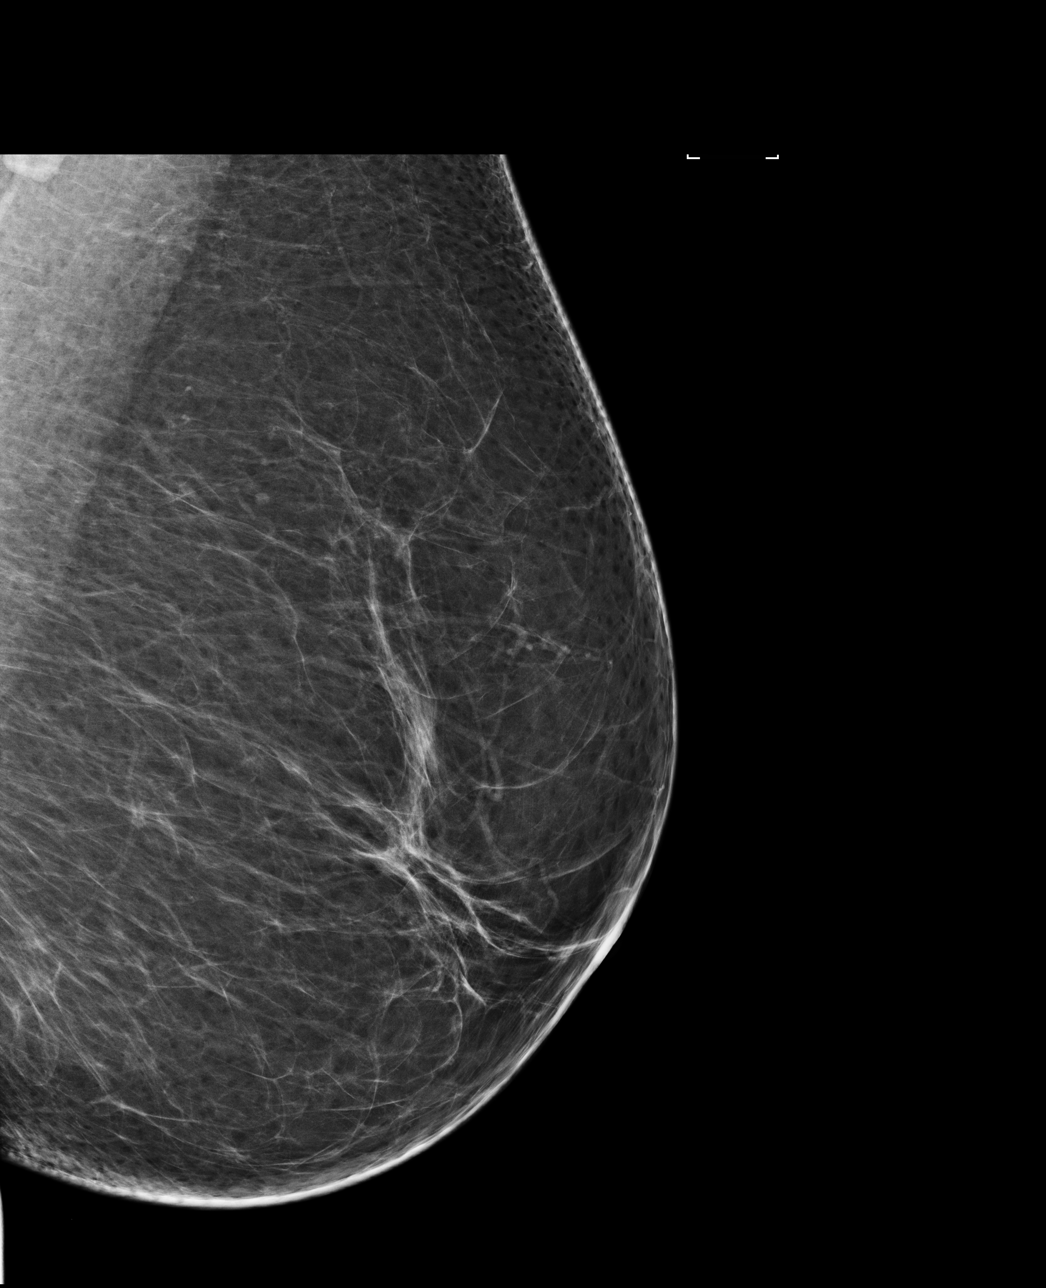

[4 of 4 positions shown; findings below may reference images not displayed]

ACR Breast Density Category b: There are scattered areas of
fibroglandular density.
FINDINGS: There are no findings suspicious for malignancy. Images were
processed with CAD.
IMPRESSION: No mammographic evidence of malignancy. A result letter of this
screening mammogram will be mailed directly to the patient.

RECOMMENDATION:
Screening mammogram in one year. (Code:AS-G-LCT)

BI-RADS CATEGORY  1: Negative.

## 2018-12-27 ENCOUNTER — Ambulatory Visit (INDEPENDENT_AMBULATORY_CARE_PROVIDER_SITE_OTHER): Payer: BLUE CROSS/BLUE SHIELD | Admitting: Family Medicine

## 2018-12-27 ENCOUNTER — Other Ambulatory Visit: Payer: Self-pay

## 2018-12-27 DIAGNOSIS — Z23 Encounter for immunization: Secondary | ICD-10-CM | POA: Diagnosis not present

## 2018-12-27 DIAGNOSIS — E039 Hypothyroidism, unspecified: Secondary | ICD-10-CM

## 2018-12-27 DIAGNOSIS — E538 Deficiency of other specified B group vitamins: Secondary | ICD-10-CM | POA: Diagnosis not present

## 2018-12-27 NOTE — Addendum Note (Signed)
Addended by: Tommie Raymond on: 12/27/2018 10:58 AM   Modules accepted: Orders

## 2018-12-27 NOTE — Addendum Note (Signed)
Addended by: Alba Cory F on: 12/27/2018 11:16 AM   Modules accepted: Orders

## 2018-12-27 NOTE — Progress Notes (Addendum)
Patient came in for her B-12 injection. She tolerated it well. NKDA.   Patient has a follow up Thyroid appoint with you on June 23. She wants to have her labs done soon at the Hospital and do a virtual visit on June 23. She want to go to [place] and check on her parents but want go until she sees you. I advised her to do virtual visit and labs done early and go. Please sign for TSH at hospital.

## 2019-01-11 ENCOUNTER — Other Ambulatory Visit: Payer: Self-pay | Admitting: Family Medicine

## 2019-01-11 DIAGNOSIS — E039 Hypothyroidism, unspecified: Secondary | ICD-10-CM

## 2019-01-11 NOTE — Progress Notes (Unsigned)
Patient is calling regarding TSH wanting to have her lab done at the hospital. Verbal ok per Dr. Ancil Boozer.

## 2019-01-12 ENCOUNTER — Other Ambulatory Visit: Payer: Self-pay | Admitting: Family Medicine

## 2019-01-12 ENCOUNTER — Other Ambulatory Visit
Admission: RE | Admit: 2019-01-12 | Discharge: 2019-01-12 | Disposition: A | Discharge status: Home / Self Care | Payer: BLUE CROSS/BLUE SHIELD | Attending: Family Medicine | Admitting: Family Medicine

## 2019-01-12 ENCOUNTER — Other Ambulatory Visit: Payer: Self-pay

## 2019-01-12 DIAGNOSIS — E039 Hypothyroidism, unspecified: Secondary | ICD-10-CM | POA: Diagnosis not present

## 2019-01-12 LAB — TSH: TSH: 2.444 u[IU]/mL (ref 0.350–4.500)

## 2019-01-12 MED ORDER — LEVOTHYROXINE SODIUM 75 MCG PO TABS: 75.0000 ug | ORAL_TABLET | Freq: Every day | ORAL | 1 refills | Status: DC

## 2019-01-18 ENCOUNTER — Ambulatory Visit (INDEPENDENT_AMBULATORY_CARE_PROVIDER_SITE_OTHER): Payer: BLUE CROSS/BLUE SHIELD | Admitting: Family Medicine

## 2019-01-18 ENCOUNTER — Encounter: Payer: Self-pay | Admitting: Family Medicine

## 2019-01-18 ENCOUNTER — Other Ambulatory Visit: Payer: Self-pay

## 2019-01-18 VITALS — BP 140/90 | HR 83 | Temp 98.0°F | Resp 16 | Ht 64.0 in | Wt 203.8 lb

## 2019-01-18 DIAGNOSIS — Z23 Encounter for immunization: Secondary | ICD-10-CM

## 2019-01-18 DIAGNOSIS — I517 Cardiomegaly: Secondary | ICD-10-CM

## 2019-01-18 DIAGNOSIS — E039 Hypothyroidism, unspecified: Secondary | ICD-10-CM | POA: Diagnosis not present

## 2019-01-18 DIAGNOSIS — N390 Urinary tract infection, site not specified: Secondary | ICD-10-CM

## 2019-01-18 DIAGNOSIS — G4733 Obstructive sleep apnea (adult) (pediatric): Secondary | ICD-10-CM

## 2019-01-18 DIAGNOSIS — D518 Other vitamin B12 deficiency anemias: Secondary | ICD-10-CM

## 2019-01-18 LAB — POCT URINALYSIS DIPSTICK
Appearance: ABNORMAL
Bilirubin, UA: NEGATIVE
Blood, UA: POSITIVE
Glucose, UA: NEGATIVE
Ketones, UA: NEGATIVE
Leukocytes, UA: NEGATIVE
Odor: ABNORMAL
Protein, UA: NEGATIVE
Spec Grav, UA: 1.015 (ref 1.010–1.025)
Urobilinogen, UA: 0.2 E.U./dL
pH, UA: 6 (ref 5.0–8.0)

## 2019-01-18 MED ORDER — CIPROFLOXACIN HCL 250 MG PO TABS: 250.0000 mg | ORAL_TABLET | Freq: Two times a day (BID) | ORAL | 0 refills | Status: DC

## 2019-01-18 NOTE — Progress Notes (Signed)
Name: Claudia Norris   MRN: [id number]    DOB: 09-Dec-1955   Date:01/18/2019       Progress Note  Subjective  Chief Complaint  Chief Complaint  Patient presents with  . Hypothyroidism  . B12 Injection    HPI  Hypothyroidism: last TSH is at goal, done about one week ago. She denies dry skin or change in bowel movements.   Recurrent UTI: she has noticed some strong urine odor, some back pain, no dysuria or hematuria, we will recheck urine culture   B12 deficiency: last level was at goal, getting B12 injections   OSA: she has been wearing CPAP machine every night, she has grade I diastolic dysfunction on echo done 12/2017. She started CPAP since, no chest pain or palpitation She slept without a mask once and had mild sore throat and fatigue the following day   The 10-year ASCVD risk score Mikey Bussing DC Brooke Bonito., et al., 2013) is: 5.2%   Values used to calculate the score:     Age: 63 years     Sex: Female     Is Non-Hispanic African American: No     Diabetic: No     Tobacco smoker: No     Systolic Blood Pressure: 585 mmHg     Is BP treated: No     HDL Cholesterol: 57 mg/dL     Total Cholesterol: 195 mg/dL     Patient Active Problem List   Diagnosis Date Noted  . Left ventricular hypertrophy by electrocardiogram 01/07/2018  . Personal history of colonic polyps   . Family history of rectal cancer 02/29/2016  . Dyslipidemia 03/03/2015  . Gastro-esophageal reflux disease without esophagitis 03/03/2015  . History of cervical cancer 03/03/2015  . H/O: HTN (hypertension) 03/03/2015  . Blood glucose elevated 03/03/2015  . Adult hypothyroidism 03/03/2015  . Obesity (BMI 30.0-34.9) 03/03/2015  . Plantar fasciitis 03/03/2015  . OSA (obstructive sleep apnea) 03/03/2015  . Closed dislocation of jaw 03/03/2015  . Fibromyalgia syndrome 10/04/2009    Past Surgical History:  Procedure Laterality Date  . ABDOMINAL HYSTERECTOMY    . CESAREAN SECTION    . COLON SURGERY  2009   removed some  of colon  . COLONOSCOPY WITH PROPOFOL N/A 04/18/2016   Procedure: COLONOSCOPY WITH PROPOFOL;  Surgeon: Lucilla Lame, MD;  Location: [facility];  Service: Endoscopy;  Laterality: N/A;  sleep apnea    Family History  Problem Relation Age of Onset  . Hypertension Mother   . Hypothyroidism Mother   . Heart disease Mother        bypass  . Stroke Mother   . Cancer - Other Mother        rectal  . Arthritis Father   . Dementia Father   . Breast cancer Maternal Aunt        mat great aunt    Social History   Socioeconomic History  . Marital status: Married    Spouse name: Truman Hayward  . Number of children: 2  . Years of education: Not on file  . Highest education level: Some college, no degree  Occupational History  . Not on file  Social Needs  . Financial resource strain: Somewhat hard  . Food insecurity    Worry: Never true    Inability: Never true  . Transportation needs    Medical: No    Non-medical: No  Tobacco Use  . Smoking status: Never Smoker  . Smokeless tobacco: Never Used  Substance and Sexual Activity  .  Alcohol use: No    Alcohol/week: 0.0 standard drinks  . Drug use: No  . Sexual activity: Yes    Partners: Male    Birth control/protection: Post-menopausal  Lifestyle  . Physical activity    Days per week: 3 days    Minutes per session: 30 min  . Stress: Not at all  Relationships  . Social connections    Talks on phone: More than three times a week    Gets together: Once a week    Attends religious service: More than 4 times per year    Active member of club or organization: Yes    Attends meetings of clubs or organizations: More than 4 times per year    Relationship status: Married  . Intimate partner violence    Fear of current or ex partner: No    Emotionally abused: No    Physically abused: No    Forced sexual activity: No  Other Topics Concern  . Not on file  Social History Narrative   Married. Has two grown children.    One finished law  school the youngest lives with grandmother in [place] - helping her out.   She takes care of her mother and father that live in [place]      Her maternal aunt takes care of her mother in [place]      Dad has been recently put in a nursing home.     Current Outpatient Medications:  .  levothyroxine (SYNTHROID) 75 MCG tablet, Take 1 tablet (75 mcg total) by mouth daily., Disp: 90 tablet, Rfl: 1 .  Vitamin D, Ergocalciferol, (DRISDOL) 50000 units CAPS capsule, TAKE 1 CAPSULE (50,000 UNITS TOTAL) BY MOUTH EVERY 7 (SEVEN) DAYS., Disp: 12 capsule, Rfl: 2 .  aspirin EC 81 MG tablet, Take 81 mg by mouth daily., Disp: , Rfl:  .  fluticasone (FLONASE) 50 MCG/ACT nasal spray, Place 2 sprays into both nostrils as needed. (Patient not taking: Reported on 01/18/2019), Disp: 16 g, Rfl: 11 .  Nutritional Supplements (JUICE PLUS FIBRE PO), Take by mouth., Disp: , Rfl:   Current Facility-Administered Medications:  .  cyanocobalamin ((VITAMIN B-12)) injection 1,000 mcg, 1,000 mcg, Intramuscular, Q30 days, Alba CorySowles, Lequisha Cammack, MD, 1,000 mcg at 12/27/18 0957  No Known Allergies  I personally reviewed active problem list, medication list, allergies, family history, social history with the patient/caregiver today.   ROS  Constitutional: Negative for fever or weight change.  Respiratory: Negative for cough and shortness of breath.   Cardiovascular: Negative for chest pain or palpitations.  Gastrointestinal: Negative for abdominal pain, no bowel changes.  Musculoskeletal: Negative for gait problem or joint swelling.  Skin: Negative for rash.  Neurological: Negative for dizziness or headache.  No other specific complaints in a complete review of systems (except as listed in HPI above).  Objective  Vitals:   01/18/19 0920  BP: 140/90  Pulse: 83  Resp: 16  Temp: 98 F (36.7 C)  TempSrc: Oral  SpO2: 95%  Weight: 203 lb 12.8 oz (92.4 kg)  Height: 5\' 4"  (1.626 m)    Body mass index is 34.98  kg/m.  Physical Exam  Constitutional: Patient appears well-developed and well-nourished. Obese  No distress.  HEENT: head atraumatic, normocephalic, pupils equal and reactive to light,  neck supple Cardiovascular: Normal rate, regular rhythm and normal heart sounds.  No murmur heard. No BLE edema. Pulmonary/Chest: Effort normal and breath sounds normal. No respiratory distress. Abdominal: Soft.  There is no tenderness. Psychiatric:  Patient has a normal mood and affect. behavior is normal. Judgment and thought content normal.  Recent Results (from the past 2160 hour(s))  TSH     Status: None   Collection Time: 01/12/19 10:28 AM  Result Value Ref Range   TSH 2.444 0.350 - 4.500 uIU/mL    Comment: Performed by a 3rd Generation assay with a functional sensitivity of <=0.01 uIU/mL. Performed at [facility], [address] [place], [place] [address]       PHQ2/9: Depression screen [facility] 2/9 05/20/2018 01/04/2018 11/18/2017 07/14/2017 04/17/2017  Decreased Interest 0 0 0 0 0  Down, Depressed, Hopeless 0 0 0 0 0  PHQ - 2 Score 0 0 0 0 0  Altered sleeping 0 0 0 - -  Tired, decreased energy 0 1 1 - -  Change in appetite 0 0 0 - -  Feeling bad or failure about yourself  0 0 0 - -  Trouble concentrating 0 0 0 - -  Moving slowly or fidgety/restless 0 0 0 - -  Suicidal thoughts 0 0 0 - -  PHQ-9 Score 0 1 1 - -  Difficult doing work/chores Not difficult at all Not difficult at all Not difficult at all - -    phq 9 is negative   Fall Risk: Fall Risk  01/18/2019 07/06/2018 05/20/2018 01/04/2018 11/18/2017  Falls in the past year? 0 0 No Yes Yes  Number falls in past yr: 0 - - 1 1  Comment - - - - Fell off a step ladder  Injury with Fall? 0 - - No No     Functional Status Survey: Is the patient deaf or have difficulty hearing?: No Does the patient have difficulty seeing, even when wearing glasses/contacts?: No Does the patient have difficulty concentrating, remembering, or  making decisions?: No Does the patient have difficulty walking or climbing stairs?: No Does the patient have difficulty dressing or bathing?: No Does the patient have difficulty doing errands alone such as visiting a doctor's office or shopping?: No   Assessment & Plan  1. Recurrent UTI  - POCT Urinalysis Dipstick - CULTURE, URINE COMPREHENSIVE  2. OSA (obstructive sleep apnea)  Continue CPAP   3. Left ventricular hypertrophy by electrocardiogram  Started on CPAP last year   4. Adult hypothyroidism  TSH is at goal   5. Other vitamin B12 deficiency anemia  Continue monthly injection   6. Need for vaccination for pneumococcus  She refused it

## 2019-01-18 NOTE — Addendum Note (Signed)
Addended by: Steele Sizer F on: 01/18/2019 12:54 PM   Modules accepted: Orders

## 2019-01-19 ENCOUNTER — Other Ambulatory Visit: Payer: Self-pay | Admitting: Family Medicine

## 2019-01-19 DIAGNOSIS — E039 Hypothyroidism, unspecified: Secondary | ICD-10-CM

## 2019-01-20 LAB — CULTURE, URINE COMPREHENSIVE
MICRO NUMBER:: 599569
SPECIMEN QUALITY:: ADEQUATE

## 2019-02-01 ENCOUNTER — Ambulatory Visit (INDEPENDENT_AMBULATORY_CARE_PROVIDER_SITE_OTHER): Payer: Self-pay

## 2019-02-01 ENCOUNTER — Other Ambulatory Visit: Payer: Self-pay

## 2019-02-01 DIAGNOSIS — E538 Deficiency of other specified B group vitamins: Secondary | ICD-10-CM

## 2019-02-01 NOTE — Progress Notes (Signed)
Patient came in for her B-12 injection. She tolerated it well. NKDA.   

## 2019-03-07 ENCOUNTER — Ambulatory Visit (INDEPENDENT_AMBULATORY_CARE_PROVIDER_SITE_OTHER): Payer: Self-pay

## 2019-03-07 ENCOUNTER — Other Ambulatory Visit: Payer: Self-pay

## 2019-03-07 DIAGNOSIS — E538 Deficiency of other specified B group vitamins: Secondary | ICD-10-CM

## 2019-03-07 MED ORDER — CYANOCOBALAMIN 1000 MCG/ML IJ SOLN
1000.0000 ug | Freq: Once | INTRAMUSCULAR | Status: AC
  Administered 2019-03-07: 1000 ug via INTRAMUSCULAR

## 2019-03-19 ENCOUNTER — Other Ambulatory Visit: Payer: Self-pay | Admitting: Family Medicine

## 2019-04-01 ENCOUNTER — Ambulatory Visit (INDEPENDENT_AMBULATORY_CARE_PROVIDER_SITE_OTHER): Payer: BLUE CROSS/BLUE SHIELD

## 2019-04-01 ENCOUNTER — Other Ambulatory Visit: Payer: Self-pay

## 2019-04-01 DIAGNOSIS — Z23 Encounter for immunization: Secondary | ICD-10-CM | POA: Diagnosis not present

## 2019-04-01 DIAGNOSIS — E538 Deficiency of other specified B group vitamins: Secondary | ICD-10-CM | POA: Diagnosis not present

## 2019-04-01 NOTE — Progress Notes (Signed)
Patient came in for her B-12 injection. She/He tolerated it well. NKDA.    

## 2019-05-23 ENCOUNTER — Other Ambulatory Visit: Payer: Self-pay

## 2019-05-23 ENCOUNTER — Ambulatory Visit (INDEPENDENT_AMBULATORY_CARE_PROVIDER_SITE_OTHER): Payer: BLUE CROSS/BLUE SHIELD

## 2019-05-23 DIAGNOSIS — E538 Deficiency of other specified B group vitamins: Secondary | ICD-10-CM

## 2019-05-23 NOTE — Progress Notes (Signed)
Patient came in for her B-12 injection. She tolerated it well. NKDA.   

## 2019-06-07 ENCOUNTER — Other Ambulatory Visit: Payer: Self-pay | Admitting: Family Medicine

## 2019-06-07 NOTE — Telephone Encounter (Signed)
Requested medication (s) are due for refill today: yes  Requested medication (s) are on the active medication list: yes  Last refill:  03/19/2019  Future visit scheduled: yes  Notes to clinic:  Review for refill Refill cannot be delegated    Requested Prescriptions  Pending Prescriptions Disp Refills   Vitamin D, Ergocalciferol, (DRISDOL) 1.25 MG (50000 UT) CAPS capsule [Pharmacy Med Name: VITAMIN D2 1.25MG (50,000 UNIT)] 12 capsule 0    Sig: TAKE 1 CAPSULE (50,000 UNITS TOTAL) BY MOUTH EVERY 7 (SEVEN) DAYS.     Endocrinology:  Vitamins - Vitamin D Supplementation Failed - 06/07/2019  1:34 AM      Failed - 50,000 IU strengths are not delegated      Failed - Phosphate in normal range and within 360 days    No results found for: PHOS       Passed - Ca in normal range and within 360 days    Calcium  Date Value Ref Range Status  07/05/2018 9.3 8.6 - 10.4 mg/dL Final         Passed - Vitamin D in normal range and within 360 days    Vit D, 25-Hydroxy  Date Value Ref Range Status  07/05/2018 33 30 - 100 ng/mL Final    Comment:    Vitamin D Status         25-OH Vitamin D: . Deficiency:                    <20 ng/mL Insufficiency:             20 - 29 ng/mL Optimal:                 > or = 30 ng/mL . For 25-OH Vitamin D testing on patients on  D2-supplementation and patients for whom quantitation  of D2 and D3 fractions is required, the QuestAssureD(TM) 25-OH VIT D, (D2,D3), LC/MS/MS is recommended: order  code [id number] (patients >80yrs). . For more information on this test, go to: http://education.questdiagnostics.com/faq/FAQ163 (This link is being provided for  informational/educational purposes only.)          Passed - Valid encounter within last 12 months    Recent Outpatient Visits          4 months ago Recurrent UTI   [facility] Steele Sizer, MD   5 months ago B12 deficiency   [facility] Steele Sizer, MD   9 months ago  Urinary frequency   [facility] Steele Sizer, MD   11 months ago OSA (obstructive sleep apnea)   [facility] Steele Sizer, MD   1 year ago [facility] Steele Sizer, MD      Future Appointments            In 1 month Ancil Boozer, Drue Stager, MD [facility]

## 2019-06-07 NOTE — Telephone Encounter (Signed)
Copied from [place] [phone]. Topic: Quick Communication - Rx Refill/Question >> Jun 07, 2019 10:25 AM Rainey Pines A wrote: Medication:Vitamin D, Ergocalciferol, (DRISDOL) 1.25 MG (50000 UT) CAPS capsule [Pharmacy Med Name: VITAMIN D2 1.25MG (50,000 UNIT)]  Has the patient contacted their pharmacy? {Yes (Agent: If no, request that the patient contact the pharmacy for the refill.) (Agent: If yes, when and what did the pharmacy advise?)Contact PCP  Preferred Pharmacy (with phone number or street name):[id number] [place] [address], [place] [phone] (Phone) [phone] (Fax)    Agent: Please be advised that RX refills may take up to 3 business days. We ask that you follow-up with your pharmacy.

## 2019-06-08 MED ORDER — VITAMIN D (ERGOCALCIFEROL) 1.25 MG (50000 UNIT) PO CAPS: 50000.0000 [IU] | ORAL_CAPSULE | ORAL | 0 refills | Status: DC

## 2019-06-13 ENCOUNTER — Other Ambulatory Visit: Payer: Self-pay | Admitting: Family Medicine

## 2019-06-13 DIAGNOSIS — E039 Hypothyroidism, unspecified: Secondary | ICD-10-CM

## 2019-07-11 ENCOUNTER — Other Ambulatory Visit: Payer: Self-pay | Admitting: Family Medicine

## 2019-07-11 ENCOUNTER — Ambulatory Visit (INDEPENDENT_AMBULATORY_CARE_PROVIDER_SITE_OTHER): Payer: BLUE CROSS/BLUE SHIELD

## 2019-07-11 ENCOUNTER — Other Ambulatory Visit: Payer: Self-pay

## 2019-07-11 ENCOUNTER — Telehealth: Payer: Self-pay

## 2019-07-11 DIAGNOSIS — E785 Hyperlipidemia, unspecified: Secondary | ICD-10-CM

## 2019-07-11 DIAGNOSIS — I10 Essential (primary) hypertension: Secondary | ICD-10-CM

## 2019-07-11 DIAGNOSIS — R739 Hyperglycemia, unspecified: Secondary | ICD-10-CM

## 2019-07-11 DIAGNOSIS — E538 Deficiency of other specified B group vitamins: Secondary | ICD-10-CM

## 2019-07-11 DIAGNOSIS — D518 Other vitamin B12 deficiency anemias: Secondary | ICD-10-CM

## 2019-07-11 DIAGNOSIS — E039 Hypothyroidism, unspecified: Secondary | ICD-10-CM

## 2019-07-11 DIAGNOSIS — E559 Vitamin D deficiency, unspecified: Secondary | ICD-10-CM

## 2019-07-11 NOTE — Telephone Encounter (Signed)
Copied from [place] [phone]. Topic: General - Other >> Jul 11, 2019  9:05 AM Sheran Luz wrote: Patient would like to know if insurance would cover TSH lab or if she needs to have it at the hospital again. Please advise.  Patient has results on mychart and a copy has been mailed to her as well.

## 2019-07-12 LAB — CBC WITH DIFFERENTIAL/PLATELET
Absolute Monocytes: 619 cells/uL (ref 200–950)
Basophils Absolute: 61 cells/uL (ref 0–200)
Basophils Relative: 0.9 %
Eosinophils Absolute: 82 cells/uL (ref 15–500)
Eosinophils Relative: 1.2 %
HCT: 40.3 % (ref 35.0–45.0)
Hemoglobin: 13.6 g/dL (ref 11.7–15.5)
Lymphs Abs: 1700 cells/uL (ref 850–3900)
MCH: 29.8 pg (ref 27.0–33.0)
MCHC: 33.7 g/dL (ref 32.0–36.0)
MCV: 88.2 fL (ref 80.0–100.0)
MPV: 11 fL (ref 7.5–12.5)
Monocytes Relative: 9.1 %
Neutro Abs: 4338 cells/uL (ref 1500–7800)
Neutrophils Relative %: 63.8 %
Platelets: 270 10*3/uL (ref 140–400)
RBC: 4.57 10*6/uL (ref 3.80–5.10)
RDW: 13.1 % (ref 11.0–15.0)
Total Lymphocyte: 25 %
WBC: 6.8 10*3/uL (ref 3.8–10.8)

## 2019-07-12 LAB — COMPLETE METABOLIC PANEL WITH GFR
AG Ratio: 1.6 (calc) (ref 1.0–2.5)
ALT: 13 U/L (ref 6–29)
AST: 18 U/L (ref 10–35)
Albumin: 4.2 g/dL (ref 3.6–5.1)
Alkaline phosphatase (APISO): 70 U/L (ref 37–153)
BUN: 15 mg/dL (ref 7–25)
CO2: 24 mmol/L (ref 20–32)
Calcium: 9.2 mg/dL (ref 8.6–10.4)
Chloride: 106 mmol/L (ref 98–110)
Creat: 0.83 mg/dL (ref 0.50–0.99)
GFR, Est African American: 87 mL/min/{1.73_m2} (ref >=60)
GFR, Est Non African American: 75 mL/min/{1.73_m2} (ref >=60)
Globulin: 2.7 g/dL (calc) (ref 1.9–3.7)
Glucose, Bld: 86 mg/dL (ref 65–99)
Potassium: 4.3 mmol/L (ref 3.5–5.3)
Sodium: 140 mmol/L (ref 135–146)
Total Bilirubin: 0.5 mg/dL (ref 0.2–1.2)
Total Protein: 6.9 g/dL (ref 6.1–8.1)

## 2019-07-12 LAB — VITAMIN B12: Vitamin B-12: >2000 pg/mL — ABNORMAL HIGH (ref 200–1100)

## 2019-07-12 LAB — HEMOGLOBIN A1C
Hgb A1c MFr Bld: 5.3 % of total Hgb (ref <5.7)
Mean Plasma Glucose: 105 (calc)
eAG (mmol/L): 5.8 (calc)

## 2019-07-12 LAB — LIPID PANEL
Cholesterol: 186 mg/dL (ref <200)
HDL: 57 mg/dL (ref >=50)
LDL Cholesterol (Calc): 103 mg/dL (calc) — ABNORMAL HIGH
Non-HDL Cholesterol (Calc): 129 mg/dL (calc) (ref <130)
Total CHOL/HDL Ratio: 3.3 (calc) (ref <5.0)
Triglycerides: 165 mg/dL — ABNORMAL HIGH (ref <150)

## 2019-07-12 LAB — TSH: TSH: 3.94 mIU/L (ref 0.40–4.50)

## 2019-07-12 LAB — VITAMIN D 25 HYDROXY (VIT D DEFICIENCY, FRACTURES): Vit D, 25-Hydroxy: 49 ng/mL (ref 30–100)

## 2019-07-18 ENCOUNTER — Encounter: Payer: Self-pay | Admitting: Family Medicine

## 2019-07-19 ENCOUNTER — Other Ambulatory Visit: Payer: Self-pay

## 2019-07-19 ENCOUNTER — Ambulatory Visit (INDEPENDENT_AMBULATORY_CARE_PROVIDER_SITE_OTHER): Payer: BLUE CROSS/BLUE SHIELD | Admitting: Family Medicine

## 2019-07-19 ENCOUNTER — Encounter: Payer: Self-pay | Admitting: Family Medicine

## 2019-07-19 DIAGNOSIS — N390 Urinary tract infection, site not specified: Secondary | ICD-10-CM

## 2019-07-19 DIAGNOSIS — E538 Deficiency of other specified B group vitamins: Secondary | ICD-10-CM

## 2019-07-19 DIAGNOSIS — E039 Hypothyroidism, unspecified: Secondary | ICD-10-CM

## 2019-07-19 DIAGNOSIS — H6981 Other specified disorders of Eustachian tube, right ear: Secondary | ICD-10-CM

## 2019-07-19 DIAGNOSIS — E785 Hyperlipidemia, unspecified: Secondary | ICD-10-CM

## 2019-07-19 DIAGNOSIS — G4733 Obstructive sleep apnea (adult) (pediatric): Secondary | ICD-10-CM

## 2019-07-19 DIAGNOSIS — E559 Vitamin D deficiency, unspecified: Secondary | ICD-10-CM

## 2019-07-19 MED ORDER — VITAMIN D (ERGOCALCIFEROL) 1.25 MG (50000 UNIT) PO CAPS: 50000.0000 [IU] | ORAL_CAPSULE | ORAL | 0 refills | Status: DC

## 2019-07-19 MED ORDER — FLUTICASONE PROPIONATE 50 MCG/ACT NA SUSP: 2.0000 | NASAL | 2 refills | Status: DC | PRN

## 2019-07-19 NOTE — Progress Notes (Signed)
Name: Claudia Norris   MRN: [id number]    DOB: 04/02/1956   Date:07/19/2019       Progress Note  Subjective  Chief Complaint  Chief Complaint  Patient presents with  . Hypothyroidism    Missed a few doses of her medication before last blood draw.  . Recurrent UTI    She would like medication for recurrent UTI. Not having any symptoms now.    I connected with  Claudia Sabinerry J Kari  on 07/19/19 at  8:40 AM EST by a video enabled telemedicine application and verified that I am speaking with the correct person using two identifiers.  I discussed the limitations of evaluation and management by telemedicine and the availability of in person appointments. The patient expressed understanding and agreed to proceed. Staff also discussed with the patient that there may be a patient responsible charge related to this service. Patient Location: at home  [facility]  [facility] has [facility] and [facility] and will no longer be able to be our patient, but plans on returning when she gets Medicare   HPI  Hypothyroidism: last TSH was  at goal, she states she has skipped a few doses before level was drawn. She denies constipation or weight gain. Skin is always dry , no changes.   Recurrent UTI: she states episodes about twice a year, she states high glucose diet causes a flare. She denies any symptoms at this time, no back pain, dysuria , fever or chills. No change in color or odor  B12 deficiency: last level was at goal, getting B12 injections monthly   OSA: she was  wearing CPAP machine every night, she has grade I diastolic dysfunction on echo done 12/2017 and that is when she started CPAP.  No chest pain or palpitation, morning headaches or day time fatigue She states she has to replaced the mask because it was not sealing well, she states she will order a new mask.   Discussed last lipid panel  The 10-year ASCVD risk score Denman George(Goff DC Jr., et al., 2013) is:  5.6%   Values used to calculate the score:     Age: [age] years     Sex: Female     Is Non-Hispanic African American: No     Diabetic: No     Tobacco smoker: No     Systolic Blood Pressure: 140 mmHg     Is BP treated: No     HDL Cholesterol: 57 mg/dL     Total Cholesterol: 186 mg/dL   Patient Active Problem List   Diagnosis Date Noted  . Left ventricular hypertrophy by electrocardiogram 01/07/2018  . Personal history of colonic polyps   . Family history of rectal cancer 02/29/2016  . Dyslipidemia 03/03/2015  . Gastro-esophageal reflux disease without esophagitis 03/03/2015  . History of cervical cancer 03/03/2015  . H/O: HTN (hypertension) 03/03/2015  . Blood glucose elevated 03/03/2015  . Adult hypothyroidism 03/03/2015  . Obesity (BMI 30.0-34.9) 03/03/2015  . Plantar fasciitis 03/03/2015  . OSA (obstructive sleep apnea) 03/03/2015  . Closed dislocation of jaw 03/03/2015  . Fibromyalgia syndrome 10/04/2009    Past Surgical History:  Procedure Laterality Date  . ABDOMINAL HYSTERECTOMY    . CESAREAN SECTION    . COLON SURGERY  2009   removed some of colon  . COLONOSCOPY WITH PROPOFOL N/A 04/18/2016   Procedure: COLONOSCOPY WITH PROPOFOL;  Surgeon: Midge Miniumarren Wohl, MD;  [facility];  Service: Endoscopy;  Laterality:  N/A;  sleep apnea    Family History  Problem Relation Age of Onset  . Hypertension Mother   . Hypothyroidism Mother   . Heart disease Mother        bypass  . Stroke Mother   . Cancer - Other Mother        rectal  . Arthritis Father   . Dementia Father   . Breast cancer Maternal Aunt        mat great aunt      Current Outpatient Medications:  .  fluticasone (FLONASE) 50 MCG/ACT nasal spray, Place 2 sprays into both nostrils as needed., Disp: 16 g, Rfl: 11 .  Nutritional Supplements (JUICE PLUS FIBRE PO), Take by mouth., Disp: , Rfl:  .  SYNTHROID 75 MCG tablet, TAKE 1 TABLET BY MOUTH EVERY DAY, Disp: 90 tablet, Rfl: 1 .  Vitamin D,  Ergocalciferol, (DRISDOL) 1.25 MG (50000 UT) CAPS capsule, Take 1 capsule (50,000 Units total) by mouth every 7 (seven) days., Disp: 12 capsule, Rfl: 0 .  ciprofloxacin (CIPRO) 250 MG tablet, Take 1 tablet (250 mg total) by mouth 2 (two) times daily. (Patient not taking: Reported on 07/19/2019), Disp: 6 tablet, Rfl: 0  Current Facility-Administered Medications:  .  cyanocobalamin ((VITAMIN B-12)) injection 1,000 mcg, 1,000 mcg, Intramuscular, Q30 days, Alba Cory, MD, 1,000 mcg at 07/11/19 1402  No Known Allergies  I personally reviewed active problem list, medication list, allergies, family history, social history, health maintenance with the patient/caregiver today.   ROS  Ten systems reviewed and is negative except as mentioned in HPI  Objective  Virtual encounter, vitals not obtained.  There is no height or weight on file to calculate BMI.  Physical Exam  Awake, alert and oriented   PHQ2/9: Depression screen [facility] 2/9 07/19/2019 05/20/2018 01/04/2018 11/18/2017 07/14/2017  Decreased Interest 0 0 0 0 0  Down, Depressed, Hopeless 0 0 0 0 0  PHQ - 2 Score 0 0 0 0 0  Altered sleeping 0 0 0 0 -  Tired, decreased energy 0 0 1 1 -  Change in appetite 0 0 0 0 -  Feeling bad or failure about yourself  0 0 0 0 -  Trouble concentrating 0 0 0 0 -  Moving slowly or fidgety/restless 0 0 0 0 -  Suicidal thoughts 0 0 0 0 -  PHQ-9 Score 0 0 1 1 -  Difficult doing work/chores - Not difficult at all Not difficult at all Not difficult at all -   PHQ-2/9 Result is negative.    Fall Risk: Fall Risk  07/19/2019 07/19/2019 01/18/2019 07/06/2018 05/20/2018  Falls in the past year? 0 0 0 0 No  Number falls in past yr: 0 0 0 - -  Comment - - - - -  Injury with Fall? 0 0 0 - -    Assessment & Plan  1. OSA (obstructive sleep apnea)  Advised to get mask replaced   2. B12 deficiency  Continue B12 injections  3. Recurrent UTI  Advised not to get prophylactic medication since only at  most 2 episodes yearly   4. Vitamin D deficiency  - Vitamin D, Ergocalciferol, (DRISDOL) 1.25 MG (50000 UT) CAPS capsule; Take 1 capsule (50,000 Units total) by mouth every 7 (seven) days.  Dispense: 12 capsule; Refill: 0  5. Adult hypothyroidism  Continue medication   6. Dyslipidemia   7. Dysfunction of right eustachian tube  - fluticasone (FLONASE) 50 MCG/ACT nasal spray; Place 2 sprays into both  nostrils as needed.  Dispense: 16 g; Refill: 2  I discussed the assessment and treatment plan with the patient. The patient was provided an opportunity to ask questions and all were answered. The patient agreed with the plan and demonstrated an understanding of the instructions.  The patient was advised to call back or seek an in-person evaluation if the symptoms worsen or if the condition fails to improve as anticipated.  I provided 25  minutes of non-face-to-face time during this encounter.

## 2019-07-27 ENCOUNTER — Encounter: Payer: Self-pay | Admitting: Family Medicine

## 2019-07-27 ENCOUNTER — Ambulatory Visit (INDEPENDENT_AMBULATORY_CARE_PROVIDER_SITE_OTHER): Payer: BLUE CROSS/BLUE SHIELD | Admitting: Family Medicine

## 2019-07-27 ENCOUNTER — Other Ambulatory Visit: Payer: Self-pay

## 2019-07-27 VITALS — BP 144/82 | HR 80 | Temp 98.1°F | Resp 14 | Ht 62.0 in | Wt 208.1 lb

## 2019-07-27 DIAGNOSIS — R109 Unspecified abdominal pain: Secondary | ICD-10-CM | POA: Diagnosis not present

## 2019-07-27 DIAGNOSIS — N390 Urinary tract infection, site not specified: Secondary | ICD-10-CM | POA: Diagnosis not present

## 2019-07-27 DIAGNOSIS — R319 Hematuria, unspecified: Secondary | ICD-10-CM | POA: Diagnosis not present

## 2019-07-27 LAB — POCT URINALYSIS DIPSTICK
Bilirubin, UA: NEGATIVE
Glucose, UA: NEGATIVE
Ketones, UA: NEGATIVE
Nitrite, UA: POSITIVE
Protein, UA: NEGATIVE
Spec Grav, UA: 1.025 (ref 1.010–1.025)
Urobilinogen, UA: 0.2 E.U./dL
pH, UA: 7 (ref 5.0–8.0)

## 2019-07-27 MED ORDER — FLUCONAZOLE 150 MG PO TABS: 150.0000 mg | ORAL_TABLET | ORAL | 0 refills | Status: DC | PRN

## 2019-07-27 MED ORDER — CEPHALEXIN 500 MG PO CAPS: 500.0000 mg | ORAL_CAPSULE | Freq: Four times a day (QID) | ORAL | 0 refills | Status: AC

## 2019-07-27 NOTE — Progress Notes (Signed)
Patient ID: Claudia Norris, female    DOB: 1956-07-09, 63 y.o.   MRN: [id number]  PCP: Alba Cory, MD  Chief Complaint  Patient presents with  . Urinary Tract Infection    flank/back pain right side, odor and dark urine onset 1.5 weeks    Subjective:   Claudia Norris is a 63 y.o. female, presents to clinic with CC of the following:  Urinary Tract Infection  This is a recurrent problem. The current episode started 1 to 4 weeks ago (1.5 weeks). The problem occurs every urination. The problem has been gradually worsening. The quality of the pain is described as burning, aching and shooting. The pain is mild. She is not sexually active. There is a history of pyelonephritis. Associated symptoms include flank pain, frequency and urgency. Pertinent negatives include no chills, discharge, hematuria, hesitancy, nausea, possible pregnancy, sweats or vomiting. Associated symptoms comments: Urine color darker and odor. She has tried increased fluids for the symptoms. Her past medical history is significant for recurrent UTIs. There is no history of kidney stones, a single kidney, urinary stasis or a urological procedure.    Patient further states that she has a history of about 2 UTIs per year usually starting with some kind of flank pain and only earlier this year did she have some bladder pressure, urinary frequency and dysuria.  She did was referred to urology.  I did review available office visits, labs, urine testing, and micro with culture and sensitivity her last 2 UTIs were positive for E. coli which were pansensitive and antibiotics which I can see in the chart that she has taken in and she reports that she has tolerated well have been nitrofurantoin and Cipro, she does need Diflucan for secondary yeast infection with treatment with antibiotics.  Patient Active Problem List   Diagnosis Date Noted  . Left ventricular hypertrophy by electrocardiogram 01/07/2018  . Personal history of  colonic polyps   . Family history of rectal cancer 02/29/2016  . Dyslipidemia 03/03/2015  . Gastro-esophageal reflux disease without esophagitis 03/03/2015  . History of cervical cancer 03/03/2015  . H/O: HTN (hypertension) 03/03/2015  . Blood glucose elevated 03/03/2015  . Adult hypothyroidism 03/03/2015  . Obesity (BMI 30.0-34.9) 03/03/2015  . Plantar fasciitis 03/03/2015  . OSA (obstructive sleep apnea) 03/03/2015  . Closed dislocation of jaw 03/03/2015  . Fibromyalgia syndrome 10/04/2009     Current Outpatient Medications:  .  fluticasone (FLONASE) 50 MCG/ACT nasal spray, Place 2 sprays into both nostrils as needed., Disp: 16 g, Rfl: 2 .  SYNTHROID 75 MCG tablet, TAKE 1 TABLET BY MOUTH EVERY DAY, Disp: 90 tablet, Rfl: 1 .  Vitamin D, Ergocalciferol, (DRISDOL) 1.25 MG (50000 UT) CAPS capsule, Take 1 capsule (50,000 Units total) by mouth every 7 (seven) days., Disp: 12 capsule, Rfl: 0 .  Nutritional Supplements (JUICE PLUS FIBRE PO), Take by mouth., Disp: , Rfl:   Current Facility-Administered Medications:  .  cyanocobalamin ((VITAMIN B-12)) injection 1,000 mcg, 1,000 mcg, Intramuscular, Q30 days, Alba Cory, MD, 1,000 mcg at 07/11/19 1402   No Known Allergies   Family History  Problem Relation Age of Onset  . Hypertension Mother   . Hypothyroidism Mother   . Heart disease Mother        bypass  . Stroke Mother   . Cancer - Other Mother        rectal  . Arthritis Father   . Dementia Father   . Breast cancer Maternal Aunt  mat great aunt     Social History   Socioeconomic History  . Marital status: Married    Spouse name: [name]  . Number of children: 2  . Years of education: Not on file  . Highest education level: Some college, no degree  Occupational History  . Not on file  Tobacco Use  . Smoking status: Never Smoker  . Smokeless tobacco: Never Used  Substance and Sexual Activity  . Alcohol use: No    Alcohol/week: 0.0 standard drinks  . Drug  use: No  . Sexual activity: Yes    Partners: Male    Birth control/protection: Post-menopausal  Other Topics Concern  . Not on file  Social History Narrative   Married. Has two grown children.    One finished law school the youngest lives with grandmother in [place] - helping her out.   She takes care of her mother and father that live in [place]      Her maternal aunt takes care of her mother in [place]      Dad has been recently put in a nursing home.   Social Determinants of Health   Financial Resource Strain:   . Difficulty of Paying Living Expenses: Not on file  Food Insecurity:   . Worried About Programme researcher, broadcasting/film/video in the Last Year: Not on file  . Ran Out of Food in the Last Year: Not on file  Transportation Needs:   . Lack of Transportation (Medical): Not on file  . Lack of Transportation (Non-Medical): Not on file  Physical Activity:   . Days of Exercise per Week: Not on file  . Minutes of Exercise per Session: Not on file  Stress:   . Feeling of Stress : Not on file  Social Connections:   . Frequency of Communication with Friends and Family: Not on file  . Frequency of Social Gatherings with Friends and Family: Not on file  . Attends Religious Services: Not on file  . Active Member of Clubs or Organizations: Not on file  . Attends Banker Meetings: Not on file  . Marital Status: Not on file  Intimate Partner Violence:   . Fear of Current or Ex-Partner: Not on file  . Emotionally Abused: Not on file  . Physically Abused: Not on file  . Sexually Abused: Not on file    I personally reviewed active problem list, medication list, allergies, family history, social history, health maintenance, notes from last encounter, lab results, imaging with the patient/caregiver today.  Review of Systems  Constitutional: Negative.  Negative for chills.  HENT: Negative.   Eyes: Negative.   Respiratory: Negative.   Cardiovascular: Negative.   Gastrointestinal:  Negative.  Negative for nausea and vomiting.  Endocrine: Negative.   Genitourinary: Positive for flank pain, frequency and urgency. Negative for hematuria and hesitancy.  Skin: Negative.   Allergic/Immunologic: Negative.   Neurological: Negative.   Hematological: Negative.   Psychiatric/Behavioral: Negative.   All other systems reviewed and are negative.      Objective:   Vitals:   07/27/19 1544  BP: (!) 144/82  Pulse: 80  Resp: 14  Temp: 98.1 F (36.7 C)  SpO2: 98%  Weight: 208 lb 1.6 oz (94.4 kg)  Height: 5\' 2"  (1.575 m)    Body mass index is 38.06 kg/m.  Physical Exam Vitals and nursing note reviewed.  Constitutional:      General: She is not in acute distress.    Appearance: Normal appearance.  She is well-developed. She is not ill-appearing, toxic-appearing or diaphoretic.     Interventions: Face mask in place.  HENT:     Head: Normocephalic and atraumatic.     Right Ear: External ear normal.     Left Ear: External ear normal.  Eyes:     General: Lids are normal. No scleral icterus.       Right eye: No discharge.        Left eye: No discharge.     Conjunctiva/sclera: Conjunctivae normal.  Neck:     Trachea: Phonation normal. No tracheal deviation.  Cardiovascular:     Rate and Rhythm: Normal rate and regular rhythm.     Pulses: Normal pulses.          Radial pulses are 2+ on the right side and 2+ on the left side.       Posterior tibial pulses are 2+ on the right side and 2+ on the left side.     Heart sounds: Normal heart sounds. No murmur. No friction rub. No gallop.   Pulmonary:     Effort: Pulmonary effort is normal. No respiratory distress.     Breath sounds: Normal breath sounds. No stridor. No wheezing, rhonchi or rales.  Chest:     Chest wall: No tenderness.  Abdominal:     General: Bowel sounds are normal. There is no distension.     Palpations: Abdomen is soft.     Tenderness: There is no abdominal tenderness. There is no right CVA tenderness,  left CVA tenderness, guarding or rebound.  Musculoskeletal:        General: No deformity. Normal range of motion.     Cervical back: Normal range of motion and neck supple.     Right lower leg: No edema.     Left lower leg: No edema.  Lymphadenopathy:     Cervical: No cervical adenopathy.  Skin:    General: Skin is warm and dry.     Capillary Refill: Capillary refill takes less than 2 seconds.     Coloration: Skin is not jaundiced or pale.     Findings: No rash.  Neurological:     Mental Status: She is alert and oriented to person, place, and time.     Motor: No abnormal muscle tone.     Gait: Gait normal.  Psychiatric:        Mood and Affect: Mood normal.        Speech: Speech normal.        Behavior: Behavior normal.      Results for orders placed or performed in visit on 07/27/19  POCT Urinalysis Dipstick  Result Value Ref Range   Color, UA yellow    Clarity, UA clear    Glucose, UA Negative Negative   Bilirubin, UA neg    Ketones, UA neg    Spec Grav, UA 1.025 1.010 - 1.025   Blood, UA 1+    pH, UA 7.0 5.0 - 8.0   Protein, UA Negative Negative   Urobilinogen, UA 0.2 0.2 or 1.0 E.U./dL   Nitrite, UA positive    Leukocytes, UA Large (3+) (A) Negative   Appearance clear    Odor foul         Assessment & Plan:      ICD-10-CM   1. Urinary tract infection with hematuria, site unspecified  N39.0 cephALEXin (KEFLEX) 500 MG capsule   R31.9   2. Flank pain  R10.9 POCT Urinalysis Dipstick  Urine Culture    cephALEXin (KEFLEX) 500 MG capsule    Pt with UTI symptoms and history of the same, she is well-appearing, vital signs are stable, no abdominal tenderness or CVA tenderness on exam urinalysis is pertinent for leuks, nitrites and blood suspicious for a recurrent UTI.  Will treat with Keflex 4 times daily x5 days , but I have prescribed up to 7 days in case she needs a longer course of antibiotics.  Have sent her urine off for culture and we will follow up with her  with culture and sensitivity.  Reviewed concerning signs and symptoms which she should return to the office, send us a MyChart message or follow-up and she verbalized understanding.  Diflucan 150 mg tablet x2 was also sent into treat for possible yeast vaginitis secondary to antibiotics    Danelle BerryLeisa Amoreena Neubert, PA-C 07/27/19 3:55 PM

## 2019-07-29 LAB — URINE CULTURE
MICRO NUMBER:: 1241974
SPECIMEN QUALITY:: ADEQUATE

## 2019-08-02 ENCOUNTER — Other Ambulatory Visit: Payer: Self-pay | Admitting: Family Medicine

## 2019-08-02 MED ORDER — NITROFURANTOIN MONOHYD MACRO 100 MG PO CAPS: 100.0000 mg | ORAL_CAPSULE | Freq: Two times a day (BID) | ORAL | 0 refills | Status: DC

## 2019-08-19 ENCOUNTER — Other Ambulatory Visit: Payer: Self-pay | Admitting: Internal Medicine

## 2019-08-19 DIAGNOSIS — Z1231 Encounter for screening mammogram for malignant neoplasm of breast: Secondary | ICD-10-CM

## 2019-08-22 ENCOUNTER — Other Ambulatory Visit: Payer: Self-pay | Admitting: Family Medicine

## 2019-08-22 DIAGNOSIS — E559 Vitamin D deficiency, unspecified: Secondary | ICD-10-CM

## 2020-04-11 ENCOUNTER — Other Ambulatory Visit: Payer: Self-pay | Admitting: Family Medicine

## 2020-04-11 DIAGNOSIS — E559 Vitamin D deficiency, unspecified: Secondary | ICD-10-CM

## 2020-04-11 NOTE — Telephone Encounter (Signed)
Requested medication (s) are due for refill today - no  Requested medication (s) are on the active medication list -yes  Future visit scheduled -no  Last refill: 08/22/19#12 2RF  Notes to clinic: Request for non delegated Rx  Requested Prescriptions  Pending Prescriptions Disp Refills   Vitamin D, Ergocalciferol, (DRISDOL) 1.25 MG (50000 UNIT) CAPS capsule [Pharmacy Med Name: VITAMIN D2 1.25MG (50,000 UNIT)] 12 capsule 2    Sig: TAKE 1 CAPSULE (50,000 UNITS TOTAL) BY MOUTH EVERY 7 (SEVEN) DAYS.      Endocrinology:  Vitamins - Vitamin D Supplementation Failed - 04/11/2020 11:11 AM      Failed - 50,000 IU strengths are not delegated      Failed - Phosphate in normal range and within 360 days    No results found for: PHOS        Passed - Ca in normal range and within 360 days    Calcium  Date Value Ref Range Status  07/11/2019 9.2 8.6 - 10.4 mg/dL Final          Passed - Vitamin D in normal range and within 360 days    Vit D, 25-Hydroxy  Date Value Ref Range Status  07/11/2019 49 30 - 100 ng/mL Final    Comment:    Vitamin D Status         25-OH Vitamin D: . Deficiency:                    <20 ng/mL Insufficiency:             20 - 29 ng/mL Optimal:                 > or = 30 ng/mL . For 25-OH Vitamin D testing on patients on  D2-supplementation and patients for whom quantitation  of D2 and D3 fractions is required, the QuestAssureD(TM) 25-OH VIT D, (D2,D3), LC/MS/MS is recommended: order  code 99833 (patients >42yrs). See Note 1 . Note 1 . For additional information, please refer to  http://education.Quest[facility].com/faq/FAQ199  (This link is being provided for informational/ educational purposes only.)           Passed - Valid encounter within last 12 months    Recent Outpatient Visits           8 months ago Urinary tract infection with hematuria, site unspecified   [facility], Sheliah Mends, PA-C   8 months ago OSA (obstructive sleep  apnea)   [facility] Alba Cory, MD   1 year ago Recurrent UTI   [facility] Alba Cory, MD   1 year ago B12 deficiency   [facility] Alba Cory, MD   1 year ago Urinary frequency   [facility] Alba Cory, MD                  Requested Prescriptions  Pending Prescriptions Disp Refills   Vitamin D, Ergocalciferol, (DRISDOL) 1.25 MG (50000 UNIT) CAPS capsule [Pharmacy Med Name: VITAMIN D2 1.25MG (50,000 UNIT)] 12 capsule 2    Sig: TAKE 1 CAPSULE (50,000 UNITS TOTAL) BY MOUTH EVERY 7 (SEVEN) DAYS.      Endocrinology:  Vitamins - Vitamin D Supplementation Failed - 04/11/2020 11:11 AM      Failed - 50,000 IU strengths are not delegated      Failed - Phosphate in normal range and within 360 days    No results found for: PHOS  Passed - Ca in normal range and within 360 days    Calcium  Date Value Ref Range Status  07/11/2019 9.2 8.6 - 10.4 mg/dL Final          Passed - Vitamin D in normal range and within 360 days    Vit D, 25-Hydroxy  Date Value Ref Range Status  07/11/2019 49 30 - 100 ng/mL Final    Comment:    Vitamin D Status         25-OH Vitamin D: . Deficiency:                    <20 ng/mL Insufficiency:             20 - 29 ng/mL Optimal:                 > or = 30 ng/mL . For 25-OH Vitamin D testing on patients on  D2-supplementation and patients for whom quantitation  of D2 and D3 fractions is required, the QuestAssureD(TM) 25-OH VIT D, (D2,D3), LC/MS/MS is recommended: order  code 16109 (patients >54yrs). See Note 1 . Note 1 . For additional information, please refer to  http://education.[facility].com/faq/FAQ199  (This link is being provided for informational/ educational purposes only.)           Passed - Valid encounter within last 12 months    Recent Outpatient Visits           8 months ago Urinary tract infection with hematuria, site  unspecified   [facility], Sheliah Mends, PA-C   8 months ago OSA (obstructive sleep apnea)   [facility] Alba Cory, MD   1 year ago Recurrent UTI   [facility] Alba Cory, MD   1 year ago B12 deficiency   [facility] Alba Cory, MD   1 year ago Urinary frequency   [facility] Alba Cory, MD

## 2020-10-25 ENCOUNTER — Other Ambulatory Visit: Payer: Self-pay | Admitting: Internal Medicine

## 2020-10-25 DIAGNOSIS — Z1231 Encounter for screening mammogram for malignant neoplasm of breast: Secondary | ICD-10-CM

## 2021-01-24 ENCOUNTER — Other Ambulatory Visit: Payer: Self-pay | Admitting: Family Medicine

## 2021-01-24 DIAGNOSIS — E559 Vitamin D deficiency, unspecified: Secondary | ICD-10-CM

## 2021-10-24 ENCOUNTER — Other Ambulatory Visit: Payer: Self-pay | Admitting: Internal Medicine

## 2021-10-24 DIAGNOSIS — Z1231 Encounter for screening mammogram for malignant neoplasm of breast: Secondary | ICD-10-CM

## 2021-11-29 ENCOUNTER — Ambulatory Visit
Admission: RE | Admit: 2021-11-29 | Discharge: 2021-11-29 | Disposition: A | Discharge status: Home / Self Care | Payer: Medicare Other | Source: Ambulatory Visit | Attending: Internal Medicine | Admitting: Internal Medicine

## 2021-11-29 DIAGNOSIS — Z1231 Encounter for screening mammogram for malignant neoplasm of breast: Secondary | ICD-10-CM | POA: Diagnosis present

## 2021-12-03 ENCOUNTER — Encounter: Payer: Self-pay | Admitting: Podiatry

## 2021-12-03 ENCOUNTER — Ambulatory Visit (INDEPENDENT_AMBULATORY_CARE_PROVIDER_SITE_OTHER): Payer: Medicare Other

## 2021-12-03 ENCOUNTER — Ambulatory Visit: Payer: Medicare Other | Admitting: Podiatry

## 2021-12-03 ENCOUNTER — Other Ambulatory Visit: Payer: Self-pay | Admitting: Podiatry

## 2021-12-03 DIAGNOSIS — M722 Plantar fascial fibromatosis: Secondary | ICD-10-CM

## 2021-12-03 DIAGNOSIS — M7661 Achilles tendinitis, right leg: Secondary | ICD-10-CM

## 2021-12-03 DIAGNOSIS — L989 Disorder of the skin and subcutaneous tissue, unspecified: Secondary | ICD-10-CM

## 2021-12-03 DIAGNOSIS — B351 Tinea unguium: Secondary | ICD-10-CM

## 2021-12-03 MED ORDER — BETAMETHASONE SOD PHOS & ACET 6 (3-3) MG/ML IJ SUSP
3.0000 mg | Freq: Once | INTRAMUSCULAR | Status: AC
  Administered 2021-12-03: 3 mg via INTRA_ARTICULAR

## 2021-12-03 NOTE — Progress Notes (Signed)
? ?Subjective: ?66 y.o. female presenting to the office today as a new patient for evaluation of multiple complaints regarding the bilateral feet.  First the patient states that she has a lump on the posterior aspect of her right heel.  Is very tender and painful.  She enjoys walking and exercising but is unable to do so without pain.  This has been ongoing for several months now.  She would like to have it evaluated ? ?Also the patient would like to discuss the thickening and discoloration to her toenails.  She believes she has toenail fungus.  She has not done anything for treatment. ? ?Finally the patient states that she developed symptomatic calluses to the weightbearing surfaces of the bilateral feet.  She does admit to walking around her home barefoot without shoes.  She states that her husband shaves the calluses down intermittently.  She presents for further treatment and evaluation ? ? ?Past Medical History:  ?Diagnosis Date  ? GERD (gastroesophageal reflux disease)   ? Headache   ? tension  ? Motion sickness   ? car  ? Sleep apnea   ? no cpap  ? Thyroid disease   ? Vertigo   ? in past  ? ?Past Surgical History:  ?Procedure Laterality Date  ? ABDOMINAL HYSTERECTOMY    ? CESAREAN SECTION    ? COLON SURGERY  2009  ? removed some of colon  ? COLONOSCOPY WITH PROPOFOL N/A 04/18/2016  ? Procedure: COLONOSCOPY WITH PROPOFOL;  Surgeon: Midge Minium, MD;  [facility];  Service: Endoscopy;  Laterality: N/A;  sleep apnea  ? ?No Known Allergies ? ? ?Objective:  ?Physical Exam ?General: Alert and oriented x3 in no acute distress ? ?Dermatology: Hyperkeratotic lesion(s) present on the weightbearing surfaces of the bilateral feet. Pain on palpation with a central nucleated core noted. Skin is warm, dry and supple bilateral lower extremities.  ?There is also hyperkeratotic dystrophic discolored nails noted 1-5 bilateral consistent with findings of onychomycosis of the toenails ? ?Vascular: Palpable pedal  pulses bilaterally. No edema or erythema noted. Capillary refill within normal limits. ? ?Neurological: Epicritic and protective threshold grossly intact bilaterally.  ? ?Musculoskeletal Exam: Enlargement of the lateral aspect of the posterior tubercle of the calcaneus noted right lower extremity consistent with the findings on radiographic exam of posterior heel spur with inflammation of the Achilles ? ?Radiographic exam RT foot: Large posterior heel spurring noted right lower extremity on lateral view.  Internal fixation K wire noted to the distal portion of the first metatarsal as well as a small orthopedic screw to the distal portion of the fifth metatarsal given the patient's history of prior bunion and tailor's bunionectomy surgery.  Hardware appears stable and intact ? ?Assessment: ?1.  Insertional Achilles tendinitis RLE ?2.  Posterior heel spurs are only ?3.  Fungal nail infection bilateral ?4.  Symptomatic callus lesions bilateral ? ? ?Plan of Care:  ?1. Patient evaluated ?2. Excisional debridement of keratoic lesion(s) using a chisel blade was performed without incident.  Patient felt significant relief ?3.  Advised against going barefoot.  Recommend good supportive shoes with arch supports to offload pressure from the callus lesions ?4.  In regards to the toenail fungus, we discussed different treatment options including oral, topical, and laser antifungal treatment modalities.  The patient declined any oral treatment modality.  She would like to pursue topical and laser.  Appointment with our nurse in [place] for laser antifungal treatment ?5.  Injection of 0.5 cc Celestone  Soluspan injected into the lateral calcaneal tubercle right lower extremity.  Care taken to avoid direct injection into the Achilles tendon ?5.  Recommend good supportive shoes and sneakers ?6.  Return to clinic as needed ? ?Felecia Shelling, DPM ?[facility] ? ?Dr. Felecia Shelling, DPM  ?  ?[address]                                         ?[place], [place] [address]                ?Office [phone]  ?Fax [phone] ? ? ? ? ?

## 2021-12-04 ENCOUNTER — Ambulatory Visit (INDEPENDENT_AMBULATORY_CARE_PROVIDER_SITE_OTHER): Payer: Medicare Other

## 2021-12-04 DIAGNOSIS — B351 Tinea unguium: Secondary | ICD-10-CM

## 2021-12-04 NOTE — Progress Notes (Signed)
Patient presents today for the 1st laser treatment. Diagnosed with mycotic nail infection by Dr. Logan Bores.  ? ?Toenail most affected right hallux. ? ?All other systems are negative. ? ?Nails were filed thin. Laser therapy was administered to 1-5 toenails bilateral and patient tolerated the treatment well. All safety precautions were in place.  ? ? ?Follow up in 4 weeks for laser # 2. ? ?

## 2021-12-04 NOTE — Patient Instructions (Signed)

## 2021-12-05 ENCOUNTER — Telehealth: Payer: Self-pay | Admitting: Podiatry

## 2021-12-05 NOTE — Telephone Encounter (Signed)
Pt had cortisone shot on her right heel on Tuesday 5/9 with Dr Logan Bores and laser treatment on yesterday 5/10. Around 530pm on Wednesday, she began to feel flushed on her face, with burning, its red and has a rash on face and neck. Also feels like her sinus's are sore with a headache. Has taken benadryl and tylenol for the symptoms. She is not sure if this came from the cortisone shot or laser. ? ?Please advise.  ? ? ?

## 2021-12-06 ENCOUNTER — Telehealth: Payer: Self-pay | Admitting: *Deleted

## 2021-12-06 NOTE — Telephone Encounter (Signed)
Called patient to notify and giving physician's recommendations,verbalized understanding

## 2021-12-06 NOTE — Telephone Encounter (Signed)
Called patient and gave recommendations per Dr Amalia Hailey,  verbalized understanding. ?

## 2022-01-03 ENCOUNTER — Ambulatory Visit (INDEPENDENT_AMBULATORY_CARE_PROVIDER_SITE_OTHER): Payer: Medicare Other

## 2022-01-03 DIAGNOSIS — B351 Tinea unguium: Secondary | ICD-10-CM

## 2022-01-03 NOTE — Progress Notes (Signed)
Patient presents today for the 2nd laser treatment. Diagnosed with mycotic nail infection by Dr. Logan Bores.   Toenail most affected right hallux.  All other systems are negative.  Nails were filed thin. Laser therapy was administered to 1-5 toenails bilateral and patient tolerated the treatment well. All safety precautions were in place.    Follow up in 4 weeks for laser # 3.

## 2022-02-07 ENCOUNTER — Ambulatory Visit (INDEPENDENT_AMBULATORY_CARE_PROVIDER_SITE_OTHER): Payer: Self-pay | Admitting: Podiatry

## 2022-02-07 DIAGNOSIS — B351 Tinea unguium: Secondary | ICD-10-CM

## 2022-02-07 NOTE — Progress Notes (Signed)
Patient presents today for the 3rd laser treatment. Diagnosed with mycotic nail infection by Dr. Logan Bores.    Toenail most affected right hallux.   All other systems are negative.   Nails were filed thin. Laser therapy was administered to 1-5 toenails bilateral and patient tolerated the treatment well. All safety precautions were in place.      Follow up in 6 weeks for laser # 4.

## 2022-03-10 ENCOUNTER — Ambulatory Visit (INDEPENDENT_AMBULATORY_CARE_PROVIDER_SITE_OTHER): Payer: Medicare Other | Admitting: Podiatry

## 2022-03-10 DIAGNOSIS — B351 Tinea unguium: Secondary | ICD-10-CM

## 2022-03-10 NOTE — Progress Notes (Signed)
Patient presents today for the 4th laser treatment. Diagnosed with mycotic nail infection by Dr. Logan Bores.    Toenail most affected right hallux.   All other systems are negative.   Nails were filed thin. Laser therapy was administered to 1-5 toenails bilateral and patient tolerated the treatment well. All safety precautions were in place.      Follow up in 6 weeks for laser # 5.

## 2022-04-25 ENCOUNTER — Ambulatory Visit (INDEPENDENT_AMBULATORY_CARE_PROVIDER_SITE_OTHER): Payer: Medicare Other | Admitting: *Deleted

## 2022-04-25 DIAGNOSIS — B351 Tinea unguium: Secondary | ICD-10-CM

## 2022-04-25 NOTE — Progress Notes (Signed)
Patient presents today for the 5th laser treatment. Diagnosed with mycotic nail infection by Dr. Amalia Hailey.    Toenail most affected right hallux.   All other systems are negative.   Nails were filed thin. Laser therapy was administered to 1-5 toenails bilateral and patient tolerated the treatment well. All safety precautions were in place.   One pass over all unaffected nails     Follow up in 8 weeks for laser # 6

## 2022-06-13 ENCOUNTER — Ambulatory Visit (INDEPENDENT_AMBULATORY_CARE_PROVIDER_SITE_OTHER): Payer: Medicare Other | Admitting: *Deleted

## 2022-06-13 NOTE — Progress Notes (Signed)
Patient presents today for the 6th laser treatment. Diagnosed with mycotic nail infection by Dr. Logan Bores.    Toenail most affected right hallux.   All other systems are negative.   Nails were filed thin. Laser therapy was administered to 1-5 toenails bilateral and patient tolerated the treatment well. All safety precautions were in place.   One pass over all unaffected nails     Patient has completed the recommended laser treatments. He will follow up with Dr. Logan Bores in 3 months to evaluate progress.

## 2022-09-18 ENCOUNTER — Ambulatory Visit: Payer: Medicare Other | Admitting: Cardiovascular Disease

## 2022-09-18 ENCOUNTER — Encounter: Payer: Self-pay | Admitting: Cardiovascular Disease

## 2022-09-18 VITALS — BP 122/88 | HR 68 | Ht 65.0 in | Wt 199.0 lb

## 2022-09-18 DIAGNOSIS — I340 Nonrheumatic mitral (valve) insufficiency: Secondary | ICD-10-CM

## 2022-09-18 DIAGNOSIS — K219 Gastro-esophageal reflux disease without esophagitis: Secondary | ICD-10-CM | POA: Diagnosis not present

## 2022-09-18 DIAGNOSIS — E785 Hyperlipidemia, unspecified: Secondary | ICD-10-CM | POA: Diagnosis not present

## 2022-09-18 DIAGNOSIS — R29898 Other symptoms and signs involving the musculoskeletal system: Secondary | ICD-10-CM | POA: Diagnosis not present

## 2022-09-18 DIAGNOSIS — E669 Obesity, unspecified: Secondary | ICD-10-CM

## 2022-09-18 DIAGNOSIS — G4733 Obstructive sleep apnea (adult) (pediatric): Secondary | ICD-10-CM

## 2022-09-18 NOTE — Progress Notes (Deleted)
Cardiology Office Note   Date:  09/18/2022   ID:  Claudia Norris, Draft 03-Apr-1956, MRN [id number]  PCP:  Gladstone Lighter, MD  Cardiologist:  Neoma Laming, MD      History of Present Illness: Claudia Norris is a 67 y.o. female who presents for  Chief Complaint  Patient presents with   Follow-up    ReEstablish Care    HPI    Past Medical History:  Diagnosis Date   GERD (gastroesophageal reflux disease)    Headache    tension   Motion sickness    car   Sleep apnea    no cpap   Thyroid disease    Vertigo    in past     Past Surgical History:  Procedure Laterality Date   [place]  2009   removed some of colon   COLONOSCOPY WITH PROPOFOL N/A 04/18/2016   Procedure: COLONOSCOPY WITH PROPOFOL;  Surgeon: Lucilla Lame, MD;  Location: [facility];  Service: Endoscopy;  Laterality: N/A;  sleep apnea     Current Outpatient Medications  Medication Sig Dispense Refill   b complex vitamins capsule Take 1 capsule by mouth daily.     BIOTIN PO Take by mouth.     SYNTHROID 75 MCG tablet TAKE 1 TABLET BY MOUTH EVERY DAY 90 tablet 1   Vitamin D, Ergocalciferol, (DRISDOL) 1.25 MG (50000 UNIT) CAPS capsule TAKE 1 CAPSULE (50,000 UNITS TOTAL) BY MOUTH EVERY 7 (SEVEN) DAYS. 12 capsule 2   Current Facility-Administered Medications  Medication Dose Route Frequency Provider Last Rate Last Admin   cyanocobalamin ((VITAMIN B-12)) injection 1,000 mcg  1,000 mcg Intramuscular Q30 days Steele Sizer, MD   1,000 mcg at 07/11/19 1402    Allergies:   Patient has no known allergies.    Social History:   reports that she has never smoked. She has never used smokeless tobacco. She reports that she does not drink alcohol and does not use drugs.   Family History:  family history includes Arthritis in her father; Breast cancer in her maternal aunt; Cancer - Other in her mother; Dementia in her father; Heart disease in her  mother; Hypertension in her mother; Hypothyroidism in her mother; Stroke in her mother.    ROS:     ROS    All other systems are reviewed and negative.    PHYSICAL EXAM: VS:  BP 122/88   Pulse 68   Ht 5' 5"$  (1.651 m)   Wt 199 lb (90.3 kg)   SpO2 98%   BMI 33.12 kg/m  , BMI Body mass index is 33.12 kg/m. Last weight:  Wt Readings from Last 3 Encounters:  09/18/22 199 lb (90.3 kg)  07/27/19 208 lb 1.6 oz (94.4 kg)  01/18/19 203 lb 12.8 oz (92.4 kg)     Physical Exam    EKG:   Recent Labs: No results found for requested labs within last 365 days.    Lipid Panel    Component Value Date/Time   CHOL 186 07/11/2019 0000   CHOL 208 (H) 02/28/2015 1056   TRIG 165 (H) 07/11/2019 0000   HDL 57 07/11/2019 0000   HDL 48 02/28/2015 1056   CHOLHDL 3.3 07/11/2019 0000   VLDL 22 02/29/2016 0920   LDLCALC 103 (H) 07/11/2019 0000      Other studies Reviewed: Additional studies/ records that were reviewed today include:  Review of the above  records demonstrates:       No data to display            ASSESSMENT AND PLAN:  No diagnosis found.   Problem List Items Addressed This Visit   None      Disposition:   No follow-ups on file.    Total time spent: {AMA time spent:29001} minutes  Signed,  Neoma Laming, MD  09/18/2022 9:51 [place]

## 2022-09-18 NOTE — Assessment & Plan Note (Signed)
Patient reports having arm heaviness and bilateral leg heaviness while traveling to [place]. Denies chest pain. WIll order stress test and echo.

## 2022-09-18 NOTE — Progress Notes (Signed)
Cardiology Office Note   Date:  09/18/2022   ID:  Claudia, Norris 07-08-1956, MRN [id number]  PCP:  Gladstone Lighter, MD  Cardiologist:  Neoma Laming, MD      History of Present Illness: Claudia Norris is a 67 y.o. female who presents for  Chief Complaint  Patient presents with   Follow-up    ReEstablish Care    Patient in office to re-establish care. Denies chest pain, shortness of breath. Has been under increased stress. Had episode last week, left arm and both legs felt heavy.      Past Medical History:  Diagnosis Date   GERD (gastroesophageal reflux disease)    Headache    tension   Motion sickness    car   Sleep apnea    no cpap   Thyroid disease    Vertigo    in past     Past Surgical History:  Procedure Laterality Date   ABDOMINAL HYSTERECTOMY     CESAREAN SECTION     COLON SURGERY  2009   removed some of colon   COLONOSCOPY WITH PROPOFOL N/A 04/18/2016   Procedure: COLONOSCOPY WITH PROPOFOL;  Surgeon: Lucilla Lame, MD;  Location: [facility];  Service: Endoscopy;  Laterality: N/A;  sleep apnea     Current Outpatient Medications  Medication Sig Dispense Refill   b complex vitamins capsule Take 1 capsule by mouth daily.     BIOTIN PO Take by mouth.     SYNTHROID 75 MCG tablet TAKE 1 TABLET BY MOUTH EVERY DAY 90 tablet 1   Vitamin D, Ergocalciferol, (DRISDOL) 1.25 MG (50000 UNIT) CAPS capsule TAKE 1 CAPSULE (50,000 UNITS TOTAL) BY MOUTH EVERY 7 (SEVEN) DAYS. 12 capsule 2   No current facility-administered medications for this visit.    Allergies:   Patient has no known allergies.    Social History:   reports that she has never smoked. She has never used smokeless tobacco. She reports that she does not drink alcohol and does not use drugs.   Family History:  family history includes Arthritis in her father; Breast cancer in her maternal aunt; Cancer - Other in her mother; Dementia in her father; Heart disease in her mother;  Hypertension in her mother; Hypothyroidism in her mother; Stroke in her mother.    ROS:     Review of Systems  Constitutional: Negative.   HENT: Negative.    Eyes: Negative.   Respiratory: Negative.    Gastrointestinal: Negative.   Genitourinary: Negative.   Musculoskeletal: Negative.   Skin: Negative.   Neurological: Negative.   Endo/Heme/Allergies: Negative.   Psychiatric/Behavioral: Negative.    All other systems reviewed and are negative.   All other systems are reviewed and negative.   PHYSICAL EXAM: VS:  BP 122/88   Pulse 68   Ht 5' 5"$  (1.651 m)   Wt 199 lb (90.3 kg)   SpO2 98%   BMI 33.12 kg/m  , BMI Body mass index is 33.12 kg/m. Last weight:  Wt Readings from Last 3 Encounters:  09/18/22 199 lb (90.3 kg)  07/27/19 208 lb 1.6 oz (94.4 kg)  01/18/19 203 lb 12.8 oz (92.4 kg)    Physical Exam Constitutional:      Appearance: Normal appearance.  Cardiovascular:     Rate and Rhythm: Normal rate and regular rhythm.     Heart sounds: Normal heart sounds.  Pulmonary:     Effort: Pulmonary effort is normal.     Breath  sounds: Normal breath sounds.  Musculoskeletal:     Right lower leg: No edema.     Left lower leg: No edema.  Neurological:     Mental Status: She is alert.     EKG: sinus rhythm, HR 67 bpm  Recent Labs: No results found for requested labs within last 365 days.    Lipid Panel    Component Value Date/Time   CHOL 186 07/11/2019 0000   CHOL 208 (H) 02/28/2015 1056   TRIG 165 (H) 07/11/2019 0000   HDL 57 07/11/2019 0000   HDL 48 02/28/2015 1056   CHOLHDL 3.3 07/11/2019 0000   VLDL 22 02/29/2016 0920   LDLCALC 103 (H) 07/11/2019 0000      Other studies Reviewed: Patient: Claudia Norris DOB:  [birth year]  Date:  09/25/2017 14:15 Provider: Neoma Laming MD Encounter: ECHO   Page 2 REASON FOR VISIT  Visit for: Echocardiogram - Abnormal ECG  Sex: Female   wt= 204  lbs.  BP= 130/90  Height= 66 inches.   TESTS   Imaging: Echocardiogram:  An echocardiogram in (2-d) mode was performed and in Doppler mode with color flow velocity mapping was performed. The aortic valve cusps are normal 1.9 cm, flow velocity was normal 1.3 m/s, and normal calculated aortic valve systolic mean flow gradient 4.5 mmHg. Mitral valve diastolic peak flow velocity E .58 m/s and E/A ratio .80. Aortic root diameter 3.0 cm. The LVOT internal diameter was normal 2.0 cm and flow velocity was normal 1.0 m/s. LV systolic dimension 2.3 cm, diastolic 4.0 cm, posterior wall thickness .97 cm, fractional shortening 43 %, and EF 75 %. IVS thickness 1.4 cm. LA dimension 5.2 cm  RIGHT atrium= 12.5 cm2. Mitral Valve =  Ea= 9.3  DT= .200  A- wave duration = .110 m.sec. Tricuspid Valve =  TR jet V= 2.2     RAP= 5  RVSP= 25.6 mmHg. Tricuspid Valve has Mild Regurgitation. Pulmonic Valve= PIEDV= 1.0 m/s. Mitral Valve has Mild Regurgitation. Aortic Valve is Normal. Pulmonic Valve has Mild Regurgitation.     ASSESSMENT  Technically adequate study.  Mildly dilated left atrium with left ventricle, right atrium and ventricle, and aorta appearing normal in size.  Normal LV systolic function.  Normal wall motion.  Mild left ventricular hypertrophy with GRADE 1 (relaxation abnormality) diastolic dysfunction.  Mild pulmonary regurgitation.  Mild tricuspid regurgitation.   Normal pulmonary artery pressure.   Mild mitral regurgitation.  No pericardial effusion.  Incidental finding on left lobe of liver.  Consider CT of abdomen/liver.     THERAPY   Referring physician: Feliz Beam  Sonographer: Iverson Alamin, RCS.   Neoma Laming MD  Electronically signed by: Neoma Laming     Date: 09/28/2017 09:59  Patient: Claudia Norris DOB:  11-21-1955  Date:  09/22/2017 08:45 Provider: Neoma Laming MD Encounter: [facility] TEST   Page 1 TESTS    [facility] [address] [place], [place]  [address] [phone] STUDY:  Rest / Gated Stress Myocardial Perfusion With Wall Motion, Left Ventricular Ejection Fraction.Treadmill Stress Test.  SEX:       Female  REFERRING PHYSICIAN:  Neoma Laming                                                                                                                                                                                                                      INDICATION FOR STUDY:  Abnormal EKG.                                                                                                                                                                                                                    TECHNIQUE:  Approximately 45 minutes following the intravenous administration of 13.2  mCi of Tc-33mSestamibi with the patient at rest in a reclined supine position with arms above their head if able to do so, SPECT imaging of the heart was performed.  The patient then underwent stress testing.  At peak stress, the patient was injected intravenously with 30.0  mCi of Tc-981mestamibi.  Approximately 45 minutes later in the same position as rest imaging, gated SPECT imaging of the heart was performed.  STRESS BY:  ShNeoma LamingMD PROTOCOL:   BrDarnell Level  MAX PRED HR: 158                     85%:  134              75%: 119                                                                                                                   RESTING BP: 112/70    RESTING HR: 77   PEAK BP: 142/90     PEAK HR: 136                                                                    EXERCISE DURATION:  7:32                                            METS:  9.3    REASON FOR TEST  TERMINATION:  Target reached/1 min post injection.                                                                                                                                 SYMPTOMS: None  [facility] TREADMILL SCORE: 8                                       RISK: Low  EKG RESULTS: NSR. 72/min. No significant ST changes with exercise but T wave inversion in inferior leads intermittently.                                                                                                                                                                                                                                                                                                                                                                                                PERFUSION/WALL MOTION FINDINGS:  EF = 77%. No perfusion defects, normal wall motion.                                                                         IMPRESSION: No perfusion defects, normal LVEF. T-wave inversion in inferior leads intermittently.  Neoma Laming, MD Stress Interpreting Physician / Nuclear Interpreting Physician  Neoma Laming MD  Electronically signed by: Neoma Laming     Date: 09/28/2017 10:52   ASSESSMENT AND PLAN:    ICD-10-CM   1. Arm heaviness  R29.898 MYOCARDIAL PERFUSION IMAGING    PCV ECHOCARDIOGRAM COMPLETE    2. OSA (obstructive sleep apnea)  G47.33 MYOCARDIAL PERFUSION IMAGING    PCV ECHOCARDIOGRAM COMPLETE    3. Gastro-esophageal  reflux disease without esophagitis  K21.9     4. Dyslipidemia  E78.5 MYOCARDIAL PERFUSION IMAGING    5. Obesity (BMI 30.0-34.9)  E66.9 MYOCARDIAL PERFUSION IMAGING    PCV ECHOCARDIOGRAM COMPLETE    6. Nonrheumatic mitral valve regurgitation  I34.0 MYOCARDIAL PERFUSION IMAGING    PCV ECHOCARDIOGRAM COMPLETE       Problem List Items Addressed This Visit       Cardiovascular and Mediastinum   Nonrheumatic mitral valve regurgitation   Relevant Orders   MYOCARDIAL PERFUSION IMAGING   PCV ECHOCARDIOGRAM COMPLETE     Respiratory   OSA (obstructive sleep apnea)   Relevant Orders   MYOCARDIAL PERFUSION IMAGING   PCV ECHOCARDIOGRAM COMPLETE     Digestive   Gastro-esophageal reflux disease without esophagitis     Other   Dyslipidemia   Relevant Orders   MYOCARDIAL PERFUSION IMAGING   Obesity (BMI 30.0-34.9)   Relevant Orders   MYOCARDIAL PERFUSION IMAGING   PCV ECHOCARDIOGRAM COMPLETE   Arm heaviness - Primary    Patient reports having arm heaviness and bilateral leg heaviness while traveling to [place]. Denies chest pain. WIll order stress test and echo.       Relevant Orders   MYOCARDIAL PERFUSION IMAGING   PCV ECHOCARDIOGRAM COMPLETE     Disposition:   Return in about 4 weeks (around 10/16/2022) for after testing.    Total time spent: 30 minutes  Signed,  Neoma Laming, MD  09/18/2022 10:26 AM    [place]

## 2022-09-25 ENCOUNTER — Ambulatory Visit (INDEPENDENT_AMBULATORY_CARE_PROVIDER_SITE_OTHER): Payer: Medicare Other | Admitting: Cardiovascular Disease

## 2022-09-25 DIAGNOSIS — E669 Obesity, unspecified: Secondary | ICD-10-CM

## 2022-09-25 DIAGNOSIS — G4733 Obstructive sleep apnea (adult) (pediatric): Secondary | ICD-10-CM

## 2022-09-25 DIAGNOSIS — E66811 Obesity, class 1: Secondary | ICD-10-CM

## 2022-09-25 DIAGNOSIS — I361 Nonrheumatic tricuspid (valve) insufficiency: Secondary | ICD-10-CM

## 2022-09-25 DIAGNOSIS — I340 Nonrheumatic mitral (valve) insufficiency: Secondary | ICD-10-CM

## 2022-09-25 DIAGNOSIS — R29898 Other symptoms and signs involving the musculoskeletal system: Secondary | ICD-10-CM

## 2022-09-29 ENCOUNTER — Ambulatory Visit (INDEPENDENT_AMBULATORY_CARE_PROVIDER_SITE_OTHER): Payer: Medicare Other

## 2022-09-29 DIAGNOSIS — R29898 Other symptoms and signs involving the musculoskeletal system: Secondary | ICD-10-CM

## 2022-09-29 DIAGNOSIS — R0609 Other forms of dyspnea: Secondary | ICD-10-CM

## 2022-09-29 DIAGNOSIS — E785 Hyperlipidemia, unspecified: Secondary | ICD-10-CM

## 2022-09-29 DIAGNOSIS — G4733 Obstructive sleep apnea (adult) (pediatric): Secondary | ICD-10-CM

## 2022-09-29 DIAGNOSIS — I340 Nonrheumatic mitral (valve) insufficiency: Secondary | ICD-10-CM

## 2022-09-29 DIAGNOSIS — E669 Obesity, unspecified: Secondary | ICD-10-CM

## 2022-09-29 MED ORDER — TECHNETIUM TC 99M SESTAMIBI GENERIC - CARDIOLITE
33.7000 | Freq: Once | INTRAVENOUS | Status: AC | PRN
  Administered 2022-09-29: 33.7 via INTRAVENOUS

## 2022-09-29 MED ORDER — TECHNETIUM TC 99M SESTAMIBI GENERIC - CARDIOLITE
11.1000 | Freq: Once | INTRAVENOUS | Status: AC | PRN
  Administered 2022-09-29: 11.1 via INTRAVENOUS

## 2022-10-02 NOTE — Progress Notes (Signed)
Closing encounter for provider.

## 2022-10-16 ENCOUNTER — Ambulatory Visit (INDEPENDENT_AMBULATORY_CARE_PROVIDER_SITE_OTHER): Payer: Medicare Other | Admitting: Cardiovascular Disease

## 2022-10-16 ENCOUNTER — Encounter: Payer: Self-pay | Admitting: Cardiovascular Disease

## 2022-10-16 VITALS — BP 120/76 | HR 83 | Ht 65.0 in | Wt 203.8 lb

## 2022-10-16 DIAGNOSIS — G4733 Obstructive sleep apnea (adult) (pediatric): Secondary | ICD-10-CM | POA: Diagnosis not present

## 2022-10-16 DIAGNOSIS — R9439 Abnormal result of other cardiovascular function study: Secondary | ICD-10-CM

## 2022-10-16 DIAGNOSIS — E782 Mixed hyperlipidemia: Secondary | ICD-10-CM

## 2022-10-16 MED ORDER — METOPROLOL TARTRATE 50 MG PO TABS: ORAL_TABLET | ORAL | 0 refills | Status: DC

## 2022-10-16 NOTE — Progress Notes (Signed)
Cardiology Office Note   Date:  10/17/2022   ID:  Claudia Norris 1955-12-06, MRN [id number]  PCP:  Gladstone Lighter, MD  Cardiologist:  Neoma Laming, MD      History of Present Illness: Claudia Norris is a 67 y.o. female who presents for  Chief Complaint  Patient presents with   Follow-up    4 week follow up    No chest pain and SOB, has strong family h/o  premature CAD      Past Medical History:  Diagnosis Date   GERD (gastroesophageal reflux disease)    Headache    tension   Motion sickness    car   Sleep apnea    no cpap   Thyroid disease    Vertigo    in past     Past Surgical History:  Procedure Laterality Date   [place]  2009   removed some of colon   COLONOSCOPY WITH PROPOFOL N/A 04/18/2016   Procedure: COLONOSCOPY WITH PROPOFOL;  Surgeon: Lucilla Lame, MD;  Location: [facility];  Service: Endoscopy;  Laterality: N/A;  sleep apnea     Current Outpatient Medications  Medication Sig Dispense Refill   b complex vitamins capsule Take 1 capsule by mouth daily.     BIOTIN PO Take by mouth.     cyanocobalamin (VITAMIN B12) 500 MCG tablet Take 500 mcg by mouth daily.     metoprolol tartrate (LOPRESSOR) 50 MG tablet Take 1 tab night before and 1 tab 90 minutes prior to ccta 3 tablet 0   SYNTHROID 75 MCG tablet TAKE 1 TABLET BY MOUTH EVERY DAY 90 tablet 1   Vitamin D, Ergocalciferol, (DRISDOL) 1.25 MG (50000 UNIT) CAPS capsule TAKE 1 CAPSULE (50,000 UNITS TOTAL) BY MOUTH EVERY 7 (SEVEN) DAYS. (Patient not taking: Reported on 10/16/2022) 12 capsule 2   No current facility-administered medications for this visit.    Allergies:   Patient has no known allergies.    Social History:   reports that she has never smoked. She has never used smokeless tobacco. She reports that she does not drink alcohol and does not use drugs.   Family History:  family history includes Arthritis in her  father; Breast cancer in her maternal aunt; Cancer - Other in her mother; Dementia in her father; Heart disease in her mother; Hypertension in her mother; Hypothyroidism in her mother; Stroke in her mother.    ROS:     Review of Systems  Constitutional: Negative.   HENT: Negative.    Eyes: Negative.   Respiratory: Negative.    Gastrointestinal: Negative.   Genitourinary: Negative.   Musculoskeletal: Negative.   Skin: Negative.   Neurological: Negative.   Endo/Heme/Allergies: Negative.   Psychiatric/Behavioral: Negative.    All other systems reviewed and are negative.     All other systems are reviewed and negative.    PHYSICAL EXAM: VS:  BP 120/76   Pulse 83   Ht 5\' 5"  (1.651 m)   Wt 203 lb 12.8 oz (92.4 kg)   SpO2 96%   BMI 33.91 kg/m  , BMI Body mass index is 33.91 kg/m. Last weight:  Wt Readings from Last 3 Encounters:  10/16/22 203 lb 12.8 oz (92.4 kg)  09/18/22 199 lb (90.3 kg)  07/27/19 208 lb 1.6 oz (94.4 kg)     Physical Exam Constitutional:      Appearance: Normal appearance.  Cardiovascular:     Rate and Rhythm: Normal rate and regular rhythm.     Heart sounds: Normal heart sounds.  Pulmonary:     Effort: Pulmonary effort is normal.     Breath sounds: Normal breath sounds.  Musculoskeletal:     Right lower leg: No edema.     Left lower leg: No edema.  Neurological:     Mental Status: She is alert.       EKG:   Recent Labs: 10/16/2022: BUN 15; Creatinine, Ser 0.68; Potassium 4.3; Sodium 142    Lipid Panel    Component Value Date/Time   CHOL 186 07/11/2019 0000   CHOL 208 (H) 02/28/2015 1056   TRIG 165 (H) 07/11/2019 0000   HDL 57 07/11/2019 0000   HDL 48 02/28/2015 1056   CHOLHDL 3.3 07/11/2019 0000   VLDL 22 02/29/2016 0920   LDLCALC 103 (H) 07/11/2019 0000      Other studies Reviewed: Additional studies/ records that were reviewed today include:  Review of the above records demonstrates:       No data to display             ASSESSMENT AND PLAN:    ICD-10-CM   1. Mixed hyperlipidemia  E78.2 CT CORONARY MORPH W/CTA COR W/SCORE W/CA W/CM &/OR WO/CM    Basic metabolic panel    2. Obstructive sleep apnea syndrome  G47.33 CT CORONARY MORPH W/CTA COR W/SCORE W/CA W/CM &/OR WO/CM    Basic metabolic panel    3. Abnormal nuclear stress test  R94.39 CT CORONARY MORPH W/CTA COR W/SCORE W/CA W/CM &/OR WO/CM    Basic metabolic panel   equivacal stress test with EKG portion moderate Duke treadmill score, advise ccta as strong family h/o of premature CAD       Problem List Items Addressed This Visit   None Visit Diagnoses     Mixed hyperlipidemia    -  Primary   Relevant Medications   metoprolol tartrate (LOPRESSOR) 50 MG tablet   Other Relevant Orders   CT CORONARY MORPH W/CTA COR W/SCORE W/CA W/CM &/OR WO/CM   Basic metabolic panel (Completed)   Obstructive sleep apnea syndrome       Relevant Orders   CT CORONARY MORPH W/CTA COR W/SCORE W/CA W/CM &/OR WO/CM   Basic metabolic panel (Completed)   Abnormal nuclear stress test       equivacal stress test with EKG portion moderate Duke treadmill score, advise ccta as strong family h/o of premature CAD   Relevant Orders   CT CORONARY MORPH W/CTA COR W/SCORE W/CA W/CM &/OR WO/CM   Basic metabolic panel (Completed)          Disposition:   Return in about 1 week (around 10/23/2022) for cta coronaries next week.    Total time spent: 30 minutes  Signed,  Neoma Laming, MD  10/17/2022 1:00 [place]

## 2022-10-17 LAB — BASIC METABOLIC PANEL
BUN/Creatinine Ratio: 22 (ref 12–28)
BUN: 15 mg/dL (ref 8–27)
CO2: 22 mmol/L (ref 20–29)
Calcium: 9.3 mg/dL (ref 8.7–10.3)
Chloride: 105 mmol/L (ref 96–106)
Creatinine, Ser: 0.68 mg/dL (ref 0.57–1.00)
Glucose: 88 mg/dL (ref 70–99)
Potassium: 4.3 mmol/L (ref 3.5–5.2)
Sodium: 142 mmol/L (ref 134–144)
eGFR: 95 mL/min/{1.73_m2} (ref >59)

## 2022-10-23 ENCOUNTER — Ambulatory Visit (INDEPENDENT_AMBULATORY_CARE_PROVIDER_SITE_OTHER): Payer: Medicare Other

## 2022-10-23 DIAGNOSIS — R9430 Abnormal result of cardiovascular function study, unspecified: Secondary | ICD-10-CM | POA: Diagnosis not present

## 2022-10-23 DIAGNOSIS — E782 Mixed hyperlipidemia: Secondary | ICD-10-CM

## 2022-10-23 DIAGNOSIS — G4733 Obstructive sleep apnea (adult) (pediatric): Secondary | ICD-10-CM

## 2022-10-23 DIAGNOSIS — R9439 Abnormal result of other cardiovascular function study: Secondary | ICD-10-CM

## 2022-10-23 MED ORDER — IOHEXOL 350 MG/ML SOLN
100.0000 mL | Freq: Once | INTRAVENOUS | Status: AC | PRN
  Administered 2022-10-23: 100 mL via INTRAVENOUS

## 2022-10-30 ENCOUNTER — Ambulatory Visit: Payer: Medicare Other | Admitting: Cardiovascular Disease

## 2023-01-07 ENCOUNTER — Other Ambulatory Visit: Payer: Self-pay | Admitting: Internal Medicine

## 2023-01-07 DIAGNOSIS — Z1231 Encounter for screening mammogram for malignant neoplasm of breast: Secondary | ICD-10-CM

## 2023-08-11 ENCOUNTER — Other Ambulatory Visit: Payer: Self-pay

## 2023-08-12 ENCOUNTER — Ambulatory Visit: Payer: Medicare Other | Admitting: Dermatology

## 2023-08-13 ENCOUNTER — Encounter: Payer: Self-pay | Admitting: Physician Assistant

## 2023-08-13 ENCOUNTER — Ambulatory Visit: Payer: Medicare Other | Admitting: Physician Assistant

## 2023-08-13 VITALS — BP 150/75 | HR 70 | Temp 97.7°F | Ht 65.0 in | Wt 208.6 lb

## 2023-08-13 DIAGNOSIS — K219 Gastro-esophageal reflux disease without esophagitis: Secondary | ICD-10-CM

## 2023-08-13 DIAGNOSIS — K449 Diaphragmatic hernia without obstruction or gangrene: Secondary | ICD-10-CM

## 2023-08-13 DIAGNOSIS — Z860100 Personal history of colon polyps, unspecified: Secondary | ICD-10-CM

## 2023-08-13 NOTE — Progress Notes (Signed)
Celso Amy, PA-C [address]  [address]  [place], [place] [address]  Main: [phone]  Fax: [phone]   Gastroenterology Consultation  Referring Provider:     Enid Baas, MD Primary Care Physician:  Enid Baas, MD Primary Gastroenterologist:  Celso Amy, PA-C / Dr. Midge Minium   Reason for Consultation:     Hiatal hernia, history of large colon polyp        HPI:   Claudia Norris is a 68 y.o. y/o female referred for consultation & management  by Enid Baas, MD. She has history of colon polyps and GERD.  03/2016 colonoscopy by Dr. Servando Snare: Non-bleeding Grade 1 internal hemorrhoids, healthy colon mucosa, no polyps or biopsies.  Evidence of colon anastomosis from previous right hemicolectomy.  04/2008 colonoscopy: 30 mm polyp in the ascending colon biopsied.  She was referred to a surgeon and underwent right hemicolectomy.  Current symptoms: Patient states she has mild intermittent acid reflux.  It is relieved with occasionally taking Tums as needed.  She also drinks milk or eat yogurt before bedtime which helps.  She is not taking H2 RB or PPI.  She denies chest pain, dysphagia, or abdominal pain.  History of chronic intermittent constipation since childhood.  Improves with high-fiber diet and increasing fluids.  She does not like taking medications.  Her mother had a large hiatal hernia and patient is wondering if she also has a hiatal hernia.  She is wondering if she needs an EGD.  She reports having EGD many years ago, report is not available.  07/02/2023 labs: Normal CBC and CMP.  Hemoglobin 12.3.  Past Medical History:  Diagnosis Date   GERD (gastroesophageal reflux disease)    Headache    tension   Motion sickness    car   Sleep apnea    no cpap   Thyroid disease    Vertigo    in past    Past Surgical History:  Procedure Laterality Date   ABDOMINAL HYSTERECTOMY     CESAREAN SECTION     COLON SURGERY  2009   removed some of colon    COLONOSCOPY WITH PROPOFOL N/A 04/18/2016   Procedure: COLONOSCOPY WITH PROPOFOL;  Surgeon: Midge Minium, MD;  Location: [facility];  Service: Endoscopy;  Laterality: N/A;  sleep apnea    Prior to Admission medications   Medication Sig Start Date End Date Taking? Authorizing Provider  b complex vitamins capsule Take 1 capsule by mouth daily.    [provider]  BIOTIN PO Take by mouth.    [provider]  cyanocobalamin (VITAMIN B12) 500 MCG tablet Take 500 mcg by mouth daily.    [provider]  metoprolol tartrate (LOPRESSOR) 50 MG tablet Take 1 tab night before and 1 tab 90 minutes prior to ccta 10/16/22   Laurier Nancy, MD  SYNTHROID 75 MCG tablet TAKE 1 TABLET BY MOUTH EVERY DAY 06/13/19   Alba Cory, MD  Vitamin D, Ergocalciferol, (DRISDOL) 1.25 MG (50000 UNIT) CAPS capsule TAKE 1 CAPSULE (50,000 UNITS TOTAL) BY MOUTH EVERY 7 (SEVEN) DAYS. Patient not taking: Reported on 10/16/2022 04/11/20   Alba Cory, MD    Family History  Problem Relation Age of Onset   Hypertension Mother    Hypothyroidism Mother    Heart disease Mother        bypass   Stroke Mother    Cancer - Other Mother        rectal   Arthritis Father  Dementia Father    Breast cancer Maternal Aunt        mat great aunt     Social History   Tobacco Use   Smoking status: Never   Smokeless tobacco: Never  Vaping Use   Vaping status: Never Used  Substance Use Topics   Alcohol use: No    Alcohol/week: 0.0 standard drinks of alcohol   Drug use: No    Allergies as of 08/13/2023   (No Known Allergies)    Review of Systems:    All systems reviewed and negative except where noted in HPI.   Physical Exam:  BP (!) 150/75   Pulse 70   Temp 97.7 F (36.5 C)   Ht 5\' 5"  (1.651 m)   Wt 208 lb 9.6 oz (94.6 kg)   BMI 34.71 kg/m  No LMP recorded. Patient has had a hysterectomy.  General:   Alert,  Well-developed, well-nourished, pleasant and cooperative in  NAD Lungs:  Respirations even and unlabored.  Clear throughout to auscultation.   No wheezes, crackles, or rhonchi. No acute distress. Heart:  Regular rate and rhythm; no murmurs, clicks, rubs, or gallops. Abdomen:  Normal bowel sounds.  No bruits.  Soft, and non-distended without masses, hepatosplenomegaly or hernias noted.  No Tenderness.  No guarding or rebound tenderness.    Neurologic:  Alert and oriented x3;  grossly normal neurologically. Psych:  Alert and cooperative. Normal mood and affect.  Imaging Studies: No results found.  Assessment and Plan:   Claudia Norris is a 68 y.o. y/o female has been referred for:  1.  GERD - Mild, controlled on TUMS prn and diet  Recommend Lifestyle Modifications to prevent Acid Reflux.  Rec. Avoid coffee, sodas, peppermint, garlic, onions, alcohol, citrus fruits, chocolate, tomatoes, fatty and spicey foods.  Avoid eating 2-3 hours before bedtime.    We discussed taking OTC Prilosec 20 Mg once daily or Pepcid 20 Mg twice daily.  Patient declines medication.  She states it is not necessary as her symptoms are mild.   2.  Hiatal hernia  I am ordering upper GI series to evaluate for hiatal hernia.    Gave patient reassurance.  EGD is not indicated at this time.  3.  History of large colon polyp s/p right hemicolectomy 2009.  Scheduling Colonoscopy I discussed risks of colonoscopy with patient to include risk of bleeding, colon perforation, and risk of sedation.  Patient expressed understanding and agrees to proceed with colonoscopy.   Clinpique Prep (pt. Requested).  Follow up prn based on test results and GI symptoms.  Celso Amy, PA-C

## 2023-08-13 NOTE — Patient Instructions (Addendum)
Clenpiq sample provided for the Colonoscopy.  Upper GI series scheduled @ 08/19/23 @9 ;45 am arrival. Nothing to eat/drink after midnight.

## 2023-08-19 ENCOUNTER — Telehealth: Payer: Self-pay

## 2023-08-19 ENCOUNTER — Ambulatory Visit
Admission: RE | Admit: 2023-08-19 | Discharge: 2023-08-19 | Disposition: A | Discharge status: Home / Self Care | Payer: Medicare Other | Source: Ambulatory Visit | Attending: Physician Assistant | Admitting: Physician Assistant

## 2023-08-19 DIAGNOSIS — K219 Gastro-esophageal reflux disease without esophagitis: Secondary | ICD-10-CM | POA: Diagnosis present

## 2023-08-19 DIAGNOSIS — K449 Diaphragmatic hernia without obstruction or gangrene: Secondary | ICD-10-CM

## 2023-08-19 NOTE — Telephone Encounter (Signed)
Spoke with patient and she agreed to EGD add on- spoke with [facility] and she will add EGD.   Call and notify patient upper GI series shows moderate to large sliding hiatal hernia with mild narrowing of the GE junction.  No esophageal masses.  No spontaneous GERD.  I recommend add on EGD in addition to colonoscopy scheduled with Dr. Servando Snare 09/10/2023 -  if patient agrees with EGD.

## 2023-08-19 NOTE — Progress Notes (Signed)
Call and notify patient upper GI series shows moderate to large sliding hiatal hernia with mild narrowing of the GE junction.  No esophageal masses.  No spontaneous GERD.  I recommend add on EGD in addition to colonoscopy scheduled with Dr. Servando Snare 09/10/2023 -  if patient agrees with EGD. Celso Amy, PA-C

## 2023-08-26 ENCOUNTER — Ambulatory Visit: Payer: Medicare Other | Admitting: Dermatology

## 2023-09-07 ENCOUNTER — Telehealth: Payer: Self-pay

## 2023-09-07 NOTE — Telephone Encounter (Signed)
 Dr Ole Berkeley is teaching 2/13 so he is not able to do procedure for this day... Wanted to see if she would be willing to move to 2/14   Called pt, no answer and no VM

## 2023-09-09 NOTE — Telephone Encounter (Signed)
I spoke to pt and she is okay with changing procedure from 2/13 to 2/14... I called Endo and spoke to [place] to have pt moved

## 2023-09-10 ENCOUNTER — Encounter: Payer: Self-pay | Admitting: Gastroenterology

## 2023-09-11 ENCOUNTER — Encounter
Admission: RE | Disposition: A | Discharge status: Home / Self Care | Payer: Self-pay | Source: Home / Self Care | Attending: Gastroenterology

## 2023-09-11 ENCOUNTER — Encounter: Payer: Self-pay | Admitting: Gastroenterology

## 2023-09-11 ENCOUNTER — Ambulatory Visit: Payer: Medicare Other | Admitting: Anesthesiology

## 2023-09-11 ENCOUNTER — Ambulatory Visit
Admission: RE | Admit: 2023-09-11 | Discharge: 2023-09-11 | Disposition: A | Discharge status: Home / Self Care | Payer: Medicare Other | Attending: Gastroenterology | Admitting: Gastroenterology

## 2023-09-11 DIAGNOSIS — K449 Diaphragmatic hernia without obstruction or gangrene: Secondary | ICD-10-CM | POA: Insufficient documentation

## 2023-09-11 DIAGNOSIS — R12 Heartburn: Secondary | ICD-10-CM | POA: Insufficient documentation

## 2023-09-11 DIAGNOSIS — Z980 Intestinal bypass and anastomosis status: Secondary | ICD-10-CM | POA: Diagnosis not present

## 2023-09-11 DIAGNOSIS — G4730 Sleep apnea, unspecified: Secondary | ICD-10-CM | POA: Insufficient documentation

## 2023-09-11 DIAGNOSIS — Z79890 Hormone replacement therapy (postmenopausal): Secondary | ICD-10-CM | POA: Insufficient documentation

## 2023-09-11 DIAGNOSIS — K641 Second degree hemorrhoids: Secondary | ICD-10-CM | POA: Diagnosis not present

## 2023-09-11 DIAGNOSIS — K219 Gastro-esophageal reflux disease without esophagitis: Secondary | ICD-10-CM | POA: Diagnosis not present

## 2023-09-11 DIAGNOSIS — E079 Disorder of thyroid, unspecified: Secondary | ICD-10-CM | POA: Insufficient documentation

## 2023-09-11 DIAGNOSIS — M797 Fibromyalgia: Secondary | ICD-10-CM | POA: Insufficient documentation

## 2023-09-11 DIAGNOSIS — Z860101 Personal history of adenomatous and serrated colon polyps: Secondary | ICD-10-CM | POA: Diagnosis not present

## 2023-09-11 DIAGNOSIS — K222 Esophageal obstruction: Secondary | ICD-10-CM | POA: Insufficient documentation

## 2023-09-11 DIAGNOSIS — Z1211 Encounter for screening for malignant neoplasm of colon: Secondary | ICD-10-CM | POA: Insufficient documentation

## 2023-09-11 DIAGNOSIS — K5730 Diverticulosis of large intestine without perforation or abscess without bleeding: Secondary | ICD-10-CM | POA: Insufficient documentation

## 2023-09-11 DIAGNOSIS — Z860100 Personal history of colon polyps, unspecified: Secondary | ICD-10-CM

## 2023-09-11 HISTORY — PX: COLONOSCOPY WITH PROPOFOL: SHX5780

## 2023-09-11 HISTORY — PX: ESOPHAGOGASTRODUODENOSCOPY: SHX5428

## 2023-09-11 SURGERY — COLONOSCOPY WITH PROPOFOL
Anesthesia: General

## 2023-09-11 MED ORDER — LIDOCAINE HCL (CARDIAC) PF 100 MG/5ML IV SOSY
PREFILLED_SYRINGE | INTRAVENOUS | Status: DC | PRN
  Administered 2023-09-11: 80 mg via INTRAVENOUS

## 2023-09-11 MED ORDER — GLYCOPYRROLATE 0.2 MG/ML IJ SOLN
INTRAMUSCULAR | Status: AC
Start: 2023-09-11 — End: ?
  Filled 2023-09-11: qty 1

## 2023-09-11 MED ORDER — EPHEDRINE 5 MG/ML INJ
INTRAVENOUS | Status: AC
  Filled 2023-09-11: qty 5

## 2023-09-11 MED ORDER — PROPOFOL 500 MG/50ML IV EMUL
INTRAVENOUS | Status: DC | PRN
  Administered 2023-09-11: 75 ug/kg/min via INTRAVENOUS

## 2023-09-11 MED ORDER — SODIUM CHLORIDE 0.9 % IV SOLN
INTRAVENOUS | Status: DC
  Administered 2023-09-11: 500 mL via INTRAVENOUS

## 2023-09-11 MED ORDER — PROPOFOL 10 MG/ML IV BOLUS
INTRAVENOUS | Status: DC | PRN
  Administered 2023-09-11 (×2): 50 mg via INTRAVENOUS

## 2023-09-11 MED ORDER — DEXMEDETOMIDINE HCL IN NACL 80 MCG/20ML IV SOLN
INTRAVENOUS | Status: AC
  Filled 2023-09-11: qty 20

## 2023-09-11 MED ORDER — GLYCOPYRROLATE 0.2 MG/ML IJ SOLN
INTRAMUSCULAR | Status: DC | PRN
  Administered 2023-09-11: .2 mg via INTRAVENOUS

## 2023-09-11 MED ORDER — DEXMEDETOMIDINE HCL IN NACL 80 MCG/20ML IV SOLN
INTRAVENOUS | Status: DC | PRN
  Administered 2023-09-11: 20 ug via INTRAVENOUS

## 2023-09-11 MED ORDER — LIDOCAINE HCL (PF) 2 % IJ SOLN
INTRAMUSCULAR | Status: AC
  Filled 2023-09-11: qty 5

## 2023-09-11 MED ORDER — EPHEDRINE SULFATE-NACL 50-0.9 MG/10ML-% IV SOSY
PREFILLED_SYRINGE | INTRAVENOUS | Status: DC | PRN
  Administered 2023-09-11: 10 mg via INTRAVENOUS
  Administered 2023-09-11: 15 mg via INTRAVENOUS

## 2023-09-11 NOTE — Anesthesia Postprocedure Evaluation (Signed)
Anesthesia Post Note  Patient: Claudia Norris  Procedure(s) Performed: COLONOSCOPY WITH PROPOFOL ESOPHAGOGASTRODUODENOSCOPY (EGD) ESOPHAGEAL DILITATION  Patient location during evaluation: Endoscopy Anesthesia Type: General Level of consciousness: awake and alert Pain management: pain level controlled Vital Signs Assessment: post-procedure vital signs reviewed and stable Respiratory status: spontaneous breathing, nonlabored ventilation, respiratory function stable and patient connected to nasal cannula oxygen Cardiovascular status: blood pressure returned to baseline and stable Postop Assessment: no apparent nausea or vomiting Anesthetic complications: no   No notable events documented.   Last Vitals:  Vitals:   09/11/23 1054 09/11/23 1104  BP: 119/70 107/62  Pulse: 84 78  Resp: 18 20  Temp:    SpO2: 96% 97%    Last Pain:  Vitals:   09/11/23 1104  TempSrc:   PainSc: 0-No pain                 Lenard Simmer

## 2023-09-11 NOTE — Transfer of Care (Signed)
Immediate Anesthesia Transfer of Care Note  Patient: Claudia Norris  Procedure(s) Performed: COLONOSCOPY WITH PROPOFOL ESOPHAGOGASTRODUODENOSCOPY (EGD) ESOPHAGEAL DILITATION  Patient Location: PACU  Anesthesia Type:General  Level of Consciousness: sedated  Airway & Oxygen Therapy: Patient Spontanous Breathing  Post-op Assessment: Report given to RN and Post -op Vital signs reviewed and stable  Post vital signs: Reviewed and stable  Last Vitals:  Vitals Value Taken Time  BP 80/55 09/11/23 1047  Temp    Pulse 72 09/11/23 1049  Resp 16 09/11/23 1049  SpO2 94 % 09/11/23 1049  Vitals shown include unfiled device data.  Last Pain:  Vitals:   09/11/23 1044  TempSrc:   PainSc: Asleep         Complications: No notable events documented.

## 2023-09-11 NOTE — H&P (Signed)
Claudia Minium, MD [facility] [address] [place], [place] [address] Phone:[phone] Fax : [phone]  Primary Care Physician:  Claudia Baas, MD Primary Gastroenterologist:  Dr. Servando Norris  Pre-Procedure History & Physical: HPI:  Claudia Norris is a 68 y.o. female is here for an endoscopy and colonoscopy.   Past Medical History:  Diagnosis Date   GERD (gastroesophageal reflux disease)    Headache    tension   Motion sickness    car   Sleep apnea    no cpap   Thyroid disease    Vertigo    in past    Past Surgical History:  Procedure Laterality Date   ABDOMINAL HYSTERECTOMY     CESAREAN SECTION     COLON SURGERY  2009   removed some of colon   COLONOSCOPY WITH PROPOFOL N/A 04/18/2016   Procedure: COLONOSCOPY WITH PROPOFOL;  Surgeon: Claudia Minium, MD;  Location: [facility];  Service: Endoscopy;  Laterality: N/A;  sleep apnea    Prior to Admission medications   Medication Sig Start Date End Date Taking? Authorizing Provider  BIOTIN PO Take by mouth.   Yes [provider]  cyanocobalamin (VITAMIN B12) 500 MCG tablet Take 500 mcg by mouth daily.   Yes [provider]  SYNTHROID 75 MCG tablet TAKE 1 TABLET BY MOUTH EVERY DAY 06/13/19  Yes Norris, Claudia Hefty, MD  metoprolol tartrate (LOPRESSOR) 50 MG tablet Take 1 tab night before and 1 tab 90 minutes prior to ccta Patient not taking: Reported on 09/11/2023 10/16/22   Claudia Nancy, MD  Vitamin D, Ergocalciferol, (DRISDOL) 1.25 MG (50000 UNIT) CAPS capsule TAKE 1 CAPSULE (50,000 UNITS TOTAL) BY MOUTH EVERY 7 (SEVEN) DAYS. Patient not taking: Reported on 10/16/2022 04/11/20   Claudia Cory, MD    Allergies as of 08/13/2023   (No Known Allergies)    Family History  Problem Relation Age of Onset   Hypertension Mother    Hypothyroidism Mother    Heart disease Mother        bypass   Stroke Mother    Cancer - Other Mother        rectal   Arthritis Father    Dementia Father    Breast  cancer Maternal Aunt        mat great aunt    Social History   Socioeconomic History   Marital status: Married    Spouse name: Claudia Norris   Number of children: 2   Years of education: Not on file   Highest education level: Some college, no degree  Occupational History   Not on file  Tobacco Use   Smoking status: Never   Smokeless tobacco: Never  Vaping Use   Vaping status: Never Used  Substance and Sexual Activity   Alcohol use: No    Alcohol/week: 0.0 standard drinks of alcohol   Drug use: No   Sexual activity: Yes    Partners: Male    Birth control/protection: Post-menopausal  Other Topics Concern   Not on file  Social History Narrative   Married. Has two grown children.    One finished law school the youngest lives with grandmother in [place] - helping her out.   She takes care of her mother and father that live in [place]      Her maternal aunt takes care of her mother in [place]      Dad has been recently put in a nursing home.   Social Drivers of Health  Financial Resource Strain: Low Risk  (07/09/2023)   Received from [facility]   Overall Financial Resource Strain (CARDIA)    Difficulty of Paying Living Expenses: Not hard at all  Food Insecurity: No Food Insecurity (07/09/2023)   Received from [facility]   Hunger Vital Sign    Worried About Running Out of Food in the Last Year: Never true    Ran Out of Food in the Last Year: Never true  Transportation Needs: No Transportation Needs (07/09/2023)   Received from [facility] - Transportation    In the past 12 months, has lack of transportation kept you from medical appointments or from getting medications?: No    Lack of Transportation (Non-Medical): No  Physical Activity: Insufficiently Active (07/14/2017)   Exercise Vital Sign    Days of Exercise per Week: 3 days    Minutes of Exercise per Session: 30 min  Stress: No Stress Concern Present  (07/14/2017)   [employer] of Occupational Health - Occupational Stress Questionnaire    Feeling of Stress : Not at all  Social Connections: Socially Integrated (07/14/2017)   Social Connection and Isolation Panel [NHANES]    Frequency of Communication with Friends and Family: More than three times a week    Frequency of Social Gatherings with Friends and Family: Once a week    Attends Religious Services: More than 4 times per year    Active Member of [employer] or Organizations: Yes    Attends Engineer, structural: More than 4 times per year    Marital Status: Married  Catering manager Violence: Not At Risk (07/14/2017)   Humiliation, Afraid, Rape, and Kick questionnaire    Fear of Current or Ex-Partner: No    Emotionally Abused: No    Physically Abused: No    Sexually Abused: No    Review of Systems: See HPI, otherwise negative ROS  Physical Exam: BP (!) 154/76   Pulse 72   Temp (!) 95.9 F (35.5 C) (Temporal)   Resp 18   Ht 5\' 5"  (1.651 m)   Wt 92.1 kg   SpO2 99%   BMI 33.78 kg/m  General:   Alert,  pleasant and cooperative in NAD Head:  Normocephalic and atraumatic. Neck:  Supple; no masses or thyromegaly. Lungs:  Clear throughout to auscultation.    Heart:  Regular rate and rhythm. Abdomen:  Soft, nontender and nondistended. Normal bowel sounds, without guarding, and without rebound.   Neurologic:  Alert and  oriented x4;  grossly normal neurologically.  Impression/Plan: Claudia Norris is here for an endoscopy and colonoscopy to be performed for a history of adenomatous polyps on 2017 and GERD   Risks, benefits, limitations, and alternatives regarding  endoscopy and colonoscopy have been reviewed with the patient.  Questions have been answered.  All parties agreeable.   Claudia Minium, MD  09/11/2023, 10:09 AM

## 2023-09-11 NOTE — Anesthesia Preprocedure Evaluation (Signed)
Anesthesia Evaluation  Patient identified by MRN, date of birth, ID band Patient awake    Reviewed: Allergy & Precautions, H&P , NPO status , Patient's Chart, lab work & pertinent test results, reviewed documented beta blocker date and time   History of Anesthesia Complications Negative for: history of anesthetic complications  Airway Mallampati: III  TM Distance: >3 FB Neck ROM: full    Dental  (+) Caps, Dental Advidsory Given   Pulmonary neg shortness of breath, sleep apnea and Continuous Positive Airway Pressure Ventilation , neg COPD, Recent URI , Resolved   Pulmonary exam normal breath sounds clear to auscultation       Cardiovascular Exercise Tolerance: Good negative cardio ROS Normal cardiovascular exam Rhythm:regular Rate:Normal     Neuro/Psych neg Seizures  Neuromuscular disease (fibromyalgia)  negative psych ROS   GI/Hepatic Neg liver ROS,GERD  Controlled,,  Endo/Other  neg diabetesHypothyroidism    Renal/GU negative Renal ROS  negative genitourinary   Musculoskeletal   Abdominal   Peds  Hematology negative hematology ROS (+)   Anesthesia Other Findings Past Medical History: No date: GERD (gastroesophageal reflux disease) No date: Headache     Comment:  tension No date: Motion sickness     Comment:  car No date: Sleep apnea     Comment:  no cpap No date: Thyroid disease No date: Vertigo     Comment:  in past   Reproductive/Obstetrics negative OB ROS                             Anesthesia Physical Anesthesia Plan  ASA: 3  Anesthesia Plan: General   Post-op Pain Management:    Induction: Intravenous  PONV Risk Score and Plan: 3 and Propofol infusion and TIVA  Airway Management Planned: Natural Airway and Nasal Cannula  Additional Equipment:   Intra-op Plan:   Post-operative Plan:   Informed Consent: I have reviewed the patients History and Physical, chart,  labs and discussed the procedure including the risks, benefits and alternatives for the proposed anesthesia with the patient or authorized representative who has indicated his/her understanding and acceptance.     Dental Advisory Given  Plan Discussed with: Anesthesiologist, CRNA and Surgeon  Anesthesia Plan Comments:        Anesthesia Quick Evaluation

## 2023-09-11 NOTE — Op Note (Signed)
[facility] Gastroenterology Patient Name: Claudia Norris Procedure Date: 09/11/2023 10:17 AM MRN: [id number] Account #: [id number] Date of Birth: 1955-09-30 Admit Type: Outpatient Age: 68 Room: [facility] ENDO ROOM 4 Gender: Female Note Status: Finalized Instrument Name: Upper Endoscope [id number] Procedure:             Upper GI endoscopy Indications:           Heartburn Providers:             Midge Minium MD, MD Referring MD:          Enid Baas, MD (Referring MD) Medicines:             Propofol per Anesthesia Complications:         No immediate complications. Procedure:             Pre-Anesthesia Assessment:                        - Prior to the procedure, a History and Physical was                         performed, and patient medications and allergies were                         reviewed. The patient's tolerance of previous                         anesthesia was also reviewed. The risks and benefits                         of the procedure and the sedation options and risks                         were discussed with the patient. All questions were                         answered, and informed consent was obtained. Prior                         Anticoagulants: The patient has taken no anticoagulant                         or antiplatelet agents. ASA Grade Assessment: II - A                         patient with mild systemic disease. After reviewing                         the risks and benefits, the patient was deemed in                         satisfactory condition to undergo the procedure.                        After obtaining informed consent, the endoscope was                         passed under direct vision. Throughout the procedure,  the patient's blood pressure, pulse, and oxygen                         saturations were monitored continuously. The Endoscope                         was introduced through the mouth, and  advanced to the                         second part of duodenum. The upper GI endoscopy was                         accomplished without difficulty. The patient tolerated                         the procedure well. Findings:      A large hiatal hernia was present.      One benign-appearing, intrinsic mild stenosis was found. The stenosis       was traversed. A TTS dilator was passed through the scope. Dilation with       a 15-16.5-18 mm balloon dilator was performed to 18 mm. The dilation       site was examined following endoscope reinsertion and showed complete       resolution of luminal narrowing.      The stomach was normal.      The examined duodenum was normal. Impression:            - Large hiatal hernia.                        - Benign-appearing esophageal stenosis. Dilated.                        - Normal stomach.                        - Normal examined duodenum.                        - No specimens collected. Recommendation:        - Discharge patient to home.                        - Resume previous diet.                        - Continue present medications.                        - Perform a colonoscopy today. Procedure Code(s):     --- Professional ---                        [phone], Esophagogastroduodenoscopy, flexible,                         transoral; with transendoscopic balloon dilation of                         esophagus (less than 30 mm diameter) Diagnosis Code(s):     --- Professional ---  R12, Heartburn                        K22.2, Esophageal obstruction CPT copyright 2022 American Medical Association. All rights reserved. The codes documented in this report are preliminary and upon coder review may  be revised to meet current compliance requirements. Midge Minium MD, MD 09/11/2023 10:31:10 AM This report has been signed electronically. Number of Addenda: 0 Note Initiated On: 09/11/2023 10:17 AM Estimated Blood Loss:  Estimated blood  loss: none.      [facility]

## 2023-09-11 NOTE — Op Note (Signed)
[facility] Gastroenterology Patient Name: Claudia Norris Procedure Date: 09/11/2023 10:16 AM MRN: [id number] Account #: [id number] Date of Birth: Jan 03, 1956 Admit Type: Outpatient Age: 68 Room: [facility] ENDO ROOM 4 Gender: Female Note Status: Finalized Instrument Name: Prentice Docker [id number] Procedure:             Colonoscopy Indications:           High risk colon cancer surveillance: Personal history                         of colonic polyps Providers:             Midge Minium MD, MD Referring MD:          Enid Baas, MD (Referring MD) Medicines:             Propofol per Anesthesia Complications:         No immediate complications. Procedure:             Pre-Anesthesia Assessment:                        - Prior to the procedure, a History and Physical was                         performed, and patient medications and allergies were                         reviewed. The patient's tolerance of previous                         anesthesia was also reviewed. The risks and benefits                         of the procedure and the sedation options and risks                         were discussed with the patient. All questions were                         answered, and informed consent was obtained. Prior                         Anticoagulants: The patient has taken no anticoagulant                         or antiplatelet agents. ASA Grade Assessment: II - A                         patient with mild systemic disease. After reviewing                         the risks and benefits, the patient was deemed in                         satisfactory condition to undergo the procedure.                        After obtaining informed consent, the colonoscope was  passed under direct vision. Throughout the procedure,                         the patient's blood pressure, pulse, and oxygen                         saturations were monitored continuously. The                          Colonoscope was introduced through the anus and                         advanced to the the ileocolonic anastomosis. The                         colonoscopy was performed without difficulty. The                         patient tolerated the procedure well. The quality of                         the bowel preparation was excellent. Findings:      The perianal and digital rectal examinations were normal.      A few small-mouthed diverticula were found in the sigmoid colon.      There was evidence of a prior end-to-side colo-colonic anastomosis in       the transverse colon. This was patent.      Non-bleeding internal hemorrhoids were found during retroflexion. The       hemorrhoids were Grade II (internal hemorrhoids that prolapse but reduce       spontaneously). Impression:            - Diverticulosis in the sigmoid colon.                        - Patent end-to-side colo-colonic anastomosis.                        - Non-bleeding internal hemorrhoids.                        - No specimens collected. Recommendation:        - Discharge patient to home.                        - Resume previous diet.                        - Continue present medications.                        - Repeat colonoscopy in 7 years for surveillance. Procedure Code(s):     --- Professional ---                        [phone], Colonoscopy, flexible; diagnostic, including                         collection of specimen(s) by brushing or washing, when  performed (separate procedure) Diagnosis Code(s):     --- Professional ---                        Z86.010, Personal history of colonic polyps CPT copyright 2022 American Medical Association. All rights reserved. The codes documented in this report are preliminary and upon coder review may  be revised to meet current compliance requirements. Midge Minium MD, MD 09/11/2023 10:43:41 AM This report has been signed  electronically. Number of Addenda: 0 Note Initiated On: 09/11/2023 10:16 AM Scope Withdrawal Time: 0 hours 6 minutes 23 seconds  Total Procedure Duration: 0 hours 8 minutes 32 seconds  Estimated Blood Loss:  Estimated blood loss: none.      [facility]

## 2023-09-14 ENCOUNTER — Encounter: Payer: Self-pay | Admitting: Gastroenterology

## 2023-10-21 ENCOUNTER — Ambulatory Visit: Admitting: Urology

## 2023-10-21 VITALS — BP 155/86 | HR 73 | Ht 65.0 in | Wt 208.4 lb

## 2023-10-21 DIAGNOSIS — N390 Urinary tract infection, site not specified: Secondary | ICD-10-CM | POA: Diagnosis not present

## 2023-10-21 LAB — MICROSCOPIC EXAMINATION

## 2023-10-21 LAB — URINALYSIS, COMPLETE
Bilirubin, UA: NEGATIVE
Glucose, UA: NEGATIVE
Ketones, UA: NEGATIVE
Nitrite, UA: NEGATIVE
Protein,UA: NEGATIVE
Specific Gravity, UA: 1.02 (ref 1.005–1.030)
Urobilinogen, Ur: 0.2 mg/dL (ref 0.2–1.0)
pH, UA: 6 (ref 5.0–7.5)

## 2023-10-21 LAB — BLADDER SCAN AMB NON-IMAGING: Scan Result: 338

## 2023-10-21 MED ORDER — ESTRADIOL 0.1 MG/GM VA CREA: TOPICAL_CREAM | VAGINAL | 12 refills | Status: AC

## 2023-10-21 NOTE — Progress Notes (Signed)
 Marcelle Overlie Plume,acting as a scribe for Vanna Scotland, MD.,have documented all relevant documentation on the behalf of Vanna Scotland, MD,as directed by  Vanna Scotland, MD while in the presence of Vanna Scotland, MD.  10/21/23 1:02 PM   Claudia Norris May 12, 1956 [id number]  Referring provider: Enid Baas, MD [address] [place],  [place] [address]  Chief Complaint  Patient presents with   Establish Care   Recurrent UTI    HPI: 68 year old female who presents today for further evaluation of presumed recurrent UTIs.   She has a history of multiple infections over the last six months with various bacteria. On 07/02/2023, her urine culture showed E. coli, which was pan-sensitive. A follow-up urine culture on 07/09/2023 was negative. On 07/17/2023, she had another positive urinalysis that grew Group B strep. On 07/30/2023, her urinalysis was again frankly positive, but a culture was negative. On 09/04/2023, she had E.coli that was sensitive only to ampicillin. She has not had any recent upper tract imaging, with her last CT scan in 2006 showing normal kidneys and a simple cyst.   She also has a history of gastrointestinal workup for endoscopy and colonoscopy due to polyps and gastroesophageal reflux disease (GERD).  She reports when she gets an infection, she has fairly traditional symptoms including urgency and pelvic pressure.  No fevers.  She sometimes has low back pain with not sure this is related.  She status post hysterectomy.  Results for orders placed or performed in visit on 10/21/23  Microscopic Examination   Urine  Result Value Ref Range   WBC, UA 6-10 (A) 0 - 5 /hpf   RBC, Urine 0-2 0 - 2 /hpf   Epithelial Cells (non renal) 0-10 0 - 10 /hpf   Bacteria, UA Few None seen/Few  Urinalysis, Complete  Result Value Ref Range   Specific Gravity, UA 1.020 1.005 - 1.030   pH, UA 6.0 5.0 - 7.5   Color, UA Yellow Yellow   Appearance Ur Clear Clear    Leukocytes,UA 1+ (A) Negative   Protein,UA Negative Negative/Trace   Glucose, UA Negative Negative   Ketones, UA Negative Negative   RBC, UA Trace (A) Negative   Bilirubin, UA Negative Negative   Urobilinogen, Ur 0.2 0.2 - 1.0 mg/dL   Nitrite, UA Negative Negative   Microscopic Examination See below:   Bladder Scan (Post Void Residual) in office  Result Value Ref Range   Scan Result 338 ml      PMH: Past Medical History:  Diagnosis Date   GERD (gastroesophageal reflux disease)    Headache    tension   Motion sickness    car   Sleep apnea    no cpap   Thyroid disease    Vertigo    in past    Surgical History: Past Surgical History:  Procedure Laterality Date   ABDOMINAL HYSTERECTOMY     CESAREAN SECTION     COLON SURGERY  2009   removed some of colon   COLONOSCOPY WITH PROPOFOL N/A 04/18/2016   Procedure: COLONOSCOPY WITH PROPOFOL;  Surgeon: Midge Minium, MD;  Location: [facility];  Service: Endoscopy;  Laterality: N/A;  sleep apnea   COLONOSCOPY WITH PROPOFOL N/A 09/11/2023   Procedure: COLONOSCOPY WITH PROPOFOL;  Surgeon: Midge Minium, MD;  Location: [facility] END[facility];  Service: Endoscopy;  Laterality: N/A;   ESOPHAGOGASTRODUODENOSCOPY N/A 09/11/2023   Procedure: ESOPHAGOGASTRODUODENOSCOPY (EGD);  Surgeon: Midge Minium, MD;  Location: [facility] ENDOSCOPY;  Service: Endoscopy;  Laterality: N/A;  Home Medications:  Allergies as of 10/21/2023   No Known Allergies      Medication List        Accurate as of October 21, 2023  1:02 PM. If you have any questions, ask your nurse or doctor.          STOP taking these medications    metoprolol tartrate 50 MG tablet Commonly known as: LOPRESSOR       TAKE these medications    BIOTIN PO Take by mouth.   cyanocobalamin 500 MCG tablet Commonly known as: VITAMIN B12 Take 500 mcg by mouth daily.   estradiol 0.1 MG/GM vaginal cream Commonly known as: ESTRACE Estrogen Cream Instruction Discard applicator  Apply pea sized amount to tip of finger to urethra before bed. Wash hands well after application. Use Monday, Wednesday and Friday   Synthroid 75 MCG tablet Generic drug: levothyroxine TAKE 1 TABLET BY MOUTH EVERY DAY   Vitamin D (Ergocalciferol) 1.25 MG (50000 UNIT) Caps capsule Commonly known as: DRISDOL TAKE 1 CAPSULE (50,000 UNITS TOTAL) BY MOUTH EVERY 7 (SEVEN) DAYS.        Family History: Family History  Problem Relation Age of Onset   Hypertension Mother    Hypothyroidism Mother    Heart disease Mother        bypass   Stroke Mother    Cancer - Other Mother        rectal   Arthritis Father    Dementia Father    Breast cancer Maternal Aunt        mat great aunt    Social History:  reports that she has never smoked. She has never used smokeless tobacco. She reports that she does not drink alcohol and does not use drugs.   Physical Exam: BP (!) 155/86   Pulse 73   Ht 5\' 5"  (1.651 m)   Wt 208 lb 6 oz (94.5 kg)   BMI 34.68 kg/m   Constitutional:  Alert and oriented, No acute distress. HEENT: [place] AT, moist mucus membranes.  Trachea midline, no masses. Neurologic: Grossly intact, no focal deficits, moving all 4 extremities. Psychiatric: Normal mood and affect.   Assessment & Plan:    1. Recurrent Urinary Tract Infections (UTIs) - She has had multiple positive urinalyses and cultures over the past six months, with varying bacteria identified, including E. coli and Group B strep. - The most recent culture on February 7th was positive for a coliform organism sensitive only to ampicillin.  - There has been no recent upper tract imaging since 2026, which previously showed normal kidneys and a simple cyst.  - Consideration for further imaging studies, such as a renal ultrasound or CT scan, may be warranted to evaluate for any anatomical abnormalities contributing to recurrent infections.  - The standard UTI management protocol will be followed, and antibiotic therapy will  be tailored based on culture sensitivities.  Recurrent UTI Prevention Strategies  Stay well hydrated. Get a moderate amount of exercise. Eat a diet rich in fruit and vegetables. Start a bowel regimen to manage your constipation. Your goal is to have consistent, formed bowel movements that are easy for you to pass. You may use either of the over-the-counter supplements Benefiber or Miralax to help with this. I recommend that you try Benefiber first and move on to Miralax if this is not helping you enough. You may adjust the recommended dose of Miralax (one capful daily) to achieve this goal. Start taking an over-the-counter cranberry supplement for  urinary tract health. Take this once or twice daily on an empty stomach, e.g. right before bed. Start taking an over-the-counter d-mannose supplement. Take this daily per packaging instructions. Start taking an over-the-counter probiotic containing the bacterial species called Lactobacillus. Take this daily. Start vaginal estrogen cream. Apply a pea-sized amount around the opening of the urethra every day for 2 weeks, then three times weekly forever.  2. History of Polyps and GERD - She has undergone recent GI workup, including endoscopy and colonoscopy, for a history of polyps and GERD.  - No new issues were identified during this visit related to these conditions.   Will call with [facility]  [facility] [address], [address] [place], [place] [address] [phone]

## 2023-10-21 NOTE — Patient Instructions (Signed)
For UTI prevention take cranberry tablets, probiotic, D-Mannose daily.  

## 2023-11-17 ENCOUNTER — Ambulatory Visit: Admitting: Urology

## 2024-01-20 ENCOUNTER — Ambulatory Visit: Admitting: Urology

## 2024-01-27 ENCOUNTER — Ambulatory Visit: Admitting: Urology

## 2024-01-27 ENCOUNTER — Encounter: Payer: Self-pay | Admitting: Urology

## 2024-01-27 VITALS — BP 142/85 | HR 70 | Ht 65.0 in | Wt 207.6 lb

## 2024-01-27 DIAGNOSIS — N390 Urinary tract infection, site not specified: Secondary | ICD-10-CM

## 2024-01-27 DIAGNOSIS — N952 Postmenopausal atrophic vaginitis: Secondary | ICD-10-CM

## 2024-01-27 LAB — BLADDER SCAN AMB NON-IMAGING: PVR: 454 WU

## 2024-02-01 NOTE — Progress Notes (Signed)
 01/27/2024 10:36 AM   Jerel JINNY Rhein 30-May-1956 [id number]  Referring provider: Sherial Bail, MD [address] [place],  [place] [address]  Urological history: 1. rUTI's - October 07, 2023 - less than 10,000 colonies - August 05, 2023 - no growth - July 30, 2023 - mixed urogenital flora less than 10,000 colonies - July 17, 2023 - beta-hemolytic Streptococcus, group B - July 09, 2023 - less than 10,000 colonies - July 02, 2023 -E. coli and Enterococcus faecalis - vaginal estrogen cream and cranberry tablets  - RUS pending   Chief Complaint  Patient presents with   Recurrent UTI   HPI: Claudia Norris is a 68 y.o. woman who presents today for 72-month follow-up for renal ultrasound results and recheck after starting vaginal estrogen cream for GSM and recurrent UTIs.  Previous records reviewed.   She has no urinary complaints today.  Patient denies any modifying or aggravating factors.  Patient denies any recent UTI's, gross hematuria, dysuria or suprapubic/flank pain.  Patient denies any fevers, chills, nausea or vomiting.    She has a long history of holding her urine for several hours.  She states that most of her life she remembers only voiding once daily.  PVR 454 mL   She is a non smoker, she does not use illicit drugs and she does not drink alcohol.   Hbg A1c (06/2023) 5.6   PMH: Past Medical History:  Diagnosis Date   GERD (gastroesophageal reflux disease)    Headache    tension   Motion sickness    car   Sleep apnea    no cpap   Thyroid  disease    Vertigo    in past    Surgical History: Past Surgical History:  Procedure Laterality Date   ABDOMINAL HYSTERECTOMY     CESAREAN SECTION     COLON SURGERY  2009   removed some of colon   COLONOSCOPY WITH PROPOFOL  N/A 04/18/2016   Procedure: COLONOSCOPY WITH PROPOFOL ;  Surgeon: Rogelia Copping, MD;  Location: [facility];  Service: Endoscopy;  Laterality: N/A;  sleep apnea    COLONOSCOPY WITH PROPOFOL  N/A 09/11/2023   Procedure: COLONOSCOPY WITH PROPOFOL ;  Surgeon: Copping Rogelia, MD;  Location: [facility] ENDOSCOPY;  Service: Endoscopy;  Laterality: N/A;   ESOPHAGOGASTRODUODENOSCOPY N/A 09/11/2023   Procedure: ESOPHAGOGASTRODUODENOSCOPY (EGD);  Surgeon: Copping Rogelia, MD;  Location: [facility] ENDOSCOPY;  Service: Endoscopy;  Laterality: N/A;    Home Medications:  Allergies as of 01/27/2024   No Known Allergies      Medication List        Accurate as of January 27, 2024 11:59 PM. If you have any questions, ask your nurse or doctor.          BIOTIN PO Take by mouth.   CRANBERRY EXTRACT PO Take by mouth.   cyanocobalamin  500 MCG tablet Commonly known as: VITAMIN B12 Take 500 mcg by mouth daily.   estradiol  0.1 MG/GM vaginal cream Commonly known as: ESTRACE  Estrogen Cream Instruction Discard applicator Apply pea sized amount to tip of finger to urethra before bed. Wash hands well after application. Use Monday, Wednesday and Friday   Synthroid  75 MCG tablet Generic drug: levothyroxine  TAKE 1 TABLET BY MOUTH EVERY DAY   Vitamin D  (Ergocalciferol ) 1.25 MG (50000 UNIT) Caps capsule Commonly known as: DRISDOL  TAKE 1 CAPSULE (50,000 UNITS TOTAL) BY MOUTH EVERY 7 (SEVEN) DAYS.        Allergies: No Known Allergies  Family History: Family History  Problem Relation Age of Onset   Hypertension Mother    Hypothyroidism Mother    Heart disease Mother        bypass   Stroke Mother    Cancer - Other Mother        rectal   Arthritis Father    Dementia Father    Breast cancer Maternal Aunt        mat great aunt    Social History: See HPI for pertinent social history  ROS: Pertinent ROS in HPI  Physical Exam: BP (!) 142/85   Pulse 70   Ht 5' 5 (1.651 m)   Wt 207 lb 9.6 oz (94.2 kg)   BMI 34.55 kg/m   Constitutional:  Well nourished. Alert and oriented, No acute distress. HEENT: [place] AT, moist mucus membranes.  Trachea midline Cardiovascular: No  clubbing, cyanosis, or edema. Respiratory: Normal respiratory effort, no increased work of breathing. GU: No CVA tenderness.  No bladder fullness or masses.  Recession of labia minora, dry, pale vulvar vaginal mucosa and loss of mucosal ridges and folds.  Normal urethral meatus, no lesions, no prolapse, no discharge.   No urethral masses, tenderness and/or tenderness. No bladder fullness, tenderness or masses. Pale vagina mucosa, poor estrogen effect, no discharge, no lesions, fair pelvic support, no cystocele and no rectocele noted.  No cervical motion tenderness.  Anus and perineum are without rashes or lesions.    Neurologic: Grossly intact, no focal deficits, moving all 4 extremities. Psychiatric: Normal mood and affect.    Laboratory Data: See EPIC and HPI  I have reviewed the labs.   Pertinent Imaging: Component     Latest Ref Rng 01/27/2024  PVR     WU 454.0     Assessment & Plan:    1. Recurrent UTI (Primary) - Discussed how incomplete bladder emptying is contributing to her recurrent urinary tract infections - Bladder Scan (Post Void Residual) in office - Discussed that we could continue to monitor her renal function and PVRs, I explained to have elevated PVRs could result in AKI as the kidneys are not able to drain the urine completely, I can instruct her on self catheterization, or week place a Foley catheter to allow for maximum bladder decompression and have a voiding trial in a week -She would like to continue to monitor at the time and try timed voids to see if she can empty her bladder more frequently and more completely - US  RENAL; Future  2. GSM - Encouraged continual use of her vaginal estrogen cream 3 nights weekly - Explained that it can address recurrent urinary tract infections    Return in about 3 weeks (around 02/17/2024) for RUS report .  These notes generated with voice recognition software. I apologize for typographical errors.  Claudia Norris  [facility] [address]  [address] [place], [place] [address] [phone]

## 2024-02-04 ENCOUNTER — Ambulatory Visit
Admission: RE | Admit: 2024-02-04 | Discharge: 2024-02-04 | Disposition: A | Discharge status: Home / Self Care | Source: Ambulatory Visit | Attending: Urology | Admitting: Urology

## 2024-02-04 DIAGNOSIS — N390 Urinary tract infection, site not specified: Secondary | ICD-10-CM | POA: Diagnosis present

## 2024-02-28 NOTE — Progress Notes (Unsigned)
 03/01/2024 9:40 PM   Jerel JINNY Rhein 03-10-1956 [id number]  Referring provider: Sherial Bail, MD [address] [place],  [place] [address]  Urological history: 1. rUTI's -RUS (01/2024) NED  - October 07, 2023 - less than 10,000 colonies - August 05, 2023 - no growth - July 30, 2023 - mixed urogenital flora less than 10,000 colonies - July 17, 2023 - beta-hemolytic Streptococcus, group B - July 09, 2023 - less than 10,000 colonies - July 02, 2023 -E. coli and Enterococcus faecalis - vaginal estrogen cream and cranberry tablets   No chief complaint on file.  HPI: Claudia Norris is a 68 y.o. woman who presents today RUS results.    Previous records reviewed.    PMH: Past Medical History:  Diagnosis Date   GERD (gastroesophageal reflux disease)    Headache    tension   Motion sickness    car   Sleep apnea    no cpap   Thyroid  disease    Vertigo    in past    Surgical History: Past Surgical History:  Procedure Laterality Date   ABDOMINAL HYSTERECTOMY     CESAREAN SECTION     COLON SURGERY  2009   removed some of colon   COLONOSCOPY WITH PROPOFOL  N/A 04/18/2016   Procedure: COLONOSCOPY WITH PROPOFOL ;  Surgeon: Rogelia Copping, MD;  Location: [facility];  Service: Endoscopy;  Laterality: N/A;  sleep apnea   COLONOSCOPY WITH PROPOFOL  N/A 09/11/2023   Procedure: COLONOSCOPY WITH PROPOFOL ;  Surgeon: Copping Rogelia, MD;  Location: [facility] ENDOSCOPY;  Service: Endoscopy;  Laterality: N/A;   ESOPHAGOGASTRODUODENOSCOPY N/A 09/11/2023   Procedure: ESOPHAGOGASTRODUODENOSCOPY (EGD);  Surgeon: Copping Rogelia, MD;  Location: [facility] ENDOSCOPY;  Service: Endoscopy;  Laterality: N/A;    Home Medications:  Allergies as of 03/01/2024   No Known Allergies      Medication List        Accurate as of February 28, 2024  9:40 PM. If you have any questions, ask your nurse or doctor.          BIOTIN PO Take by mouth.   CRANBERRY EXTRACT PO Take by  mouth.   cyanocobalamin  500 MCG tablet Commonly known as: VITAMIN B12 Take 500 mcg by mouth daily.   estradiol  0.1 MG/GM vaginal cream Commonly known as: ESTRACE  Estrogen Cream Instruction Discard applicator Apply pea sized amount to tip of finger to urethra before bed. Wash hands well after application. Use Monday, Wednesday and Friday   Synthroid  75 MCG tablet Generic drug: levothyroxine  TAKE 1 TABLET BY MOUTH EVERY DAY   Vitamin D  (Ergocalciferol ) 1.25 MG (50000 UNIT) Caps capsule Commonly known as: DRISDOL  TAKE 1 CAPSULE (50,000 UNITS TOTAL) BY MOUTH EVERY 7 (SEVEN) DAYS.        Allergies: No Known Allergies  Family History: Family History  Problem Relation Age of Onset   Hypertension Mother    Hypothyroidism Mother    Heart disease Mother        bypass   Stroke Mother    Cancer - Other Mother        rectal   Arthritis Father    Dementia Father    Breast cancer Maternal Aunt        mat great aunt    Social History: See HPI for pertinent social history  ROS: Pertinent ROS in HPI  Physical Exam: There were no vitals taken for this visit.  Constitutional:  Well nourished. Alert and oriented, No acute distress. HEENT: [place]  AT, moist mucus membranes.  Trachea midline Cardiovascular: No clubbing, cyanosis, or edema. Respiratory: Normal respiratory effort, no increased work of breathing. GU: No CVA tenderness.  No bladder fullness or masses.  Recession of labia minora, dry, pale vulvar vaginal mucosa and loss of mucosal ridges and folds.  Normal urethral meatus, no lesions, no prolapse, no discharge.   No urethral masses, tenderness and/or tenderness. No bladder fullness, tenderness or masses. Pale vagina mucosa, poor estrogen effect, no discharge, no lesions, fair pelvic support, no cystocele and no rectocele noted.  No cervical motion tenderness.  Anus and perineum are without rashes or lesions.    Neurologic: Grossly intact, no focal deficits, moving all 4  extremities. Psychiatric: Normal mood and affect.    Laboratory Data: See EPIC and HPI  I have reviewed the labs.   Pertinent Imaging: Component     Latest Ref Rng 01/27/2024  PVR     WU 454.0     Assessment & Plan:    1. Recurrent UTI (Primary) - No nidus for infection seen on RUS  2. GSM - Encouraged continual use of her vaginal estrogen cream 3 nights weekly  3. Incomplete bladder emptying -   No follow-ups on file.  These notes generated with voice recognition software. I apologize for typographical errors.  CLOTILDA HELON RIGGERS  [facility] [address]  [address] [place], [place] [address] [phone]

## 2024-03-01 ENCOUNTER — Encounter: Payer: Self-pay | Admitting: Urology

## 2024-03-01 ENCOUNTER — Ambulatory Visit (INDEPENDENT_AMBULATORY_CARE_PROVIDER_SITE_OTHER): Admitting: Urology

## 2024-03-01 VITALS — BP 159/72 | HR 86 | Ht 65.0 in | Wt 206.0 lb

## 2024-03-01 DIAGNOSIS — N952 Postmenopausal atrophic vaginitis: Secondary | ICD-10-CM

## 2024-03-01 DIAGNOSIS — R339 Retention of urine, unspecified: Secondary | ICD-10-CM

## 2024-03-01 DIAGNOSIS — N390 Urinary tract infection, site not specified: Secondary | ICD-10-CM | POA: Diagnosis not present

## 2024-03-01 LAB — URINALYSIS, COMPLETE
Bilirubin, UA: NEGATIVE
Glucose, UA: NEGATIVE
Ketones, UA: NEGATIVE
Nitrite, UA: NEGATIVE
Protein,UA: NEGATIVE
Specific Gravity, UA: <1.005 — ABNORMAL LOW (ref 1.005–1.030)
Urobilinogen, Ur: 0.2 mg/dL (ref 0.2–1.0)
pH, UA: 6 (ref 5.0–7.5)

## 2024-03-01 LAB — MICROSCOPIC EXAMINATION: WBC, UA: >30 /HPF — AB (ref 0–5)

## 2024-03-01 MED ORDER — METHENAMINE HIPPURATE 1 G PO TABS
1.0000 g | ORAL_TABLET | Freq: Two times a day (BID) | ORAL | 0 refills | Status: DC
Start: 2024-03-01 — End: 2024-06-14

## 2024-03-03 ENCOUNTER — Telehealth: Payer: Self-pay

## 2024-03-03 DIAGNOSIS — N390 Urinary tract infection, site not specified: Secondary | ICD-10-CM

## 2024-03-03 MED ORDER — CIPROFLOXACIN HCL 500 MG PO TABS: 500.0000 mg | ORAL_TABLET | Freq: Two times a day (BID) | ORAL | 0 refills | Status: DC

## 2024-03-03 MED ORDER — CIPROFLOXACIN HCL 500 MG PO TABS: 500.0000 mg | ORAL_TABLET | Freq: Two times a day (BID) | ORAL | Status: AC

## 2024-03-03 NOTE — Telephone Encounter (Signed)
 Patient called in and is having increased right side back pain. Still having cloudy urine. She was calling for results from her culture and also was hoping that we could send in the generic version of the hiprex  as the other is to expensive with her insurance. We can send a mychart message with update.

## 2024-03-04 LAB — CULTURE, URINE COMPREHENSIVE

## 2024-03-04 NOTE — Telephone Encounter (Signed)
 Recent Rx to pharmacy

## 2024-03-06 ENCOUNTER — Ambulatory Visit: Payer: Self-pay | Admitting: Urology

## 2024-05-11 ENCOUNTER — Other Ambulatory Visit: Payer: Self-pay | Admitting: Internal Medicine

## 2024-05-11 DIAGNOSIS — Z1231 Encounter for screening mammogram for malignant neoplasm of breast: Secondary | ICD-10-CM

## 2024-06-14 ENCOUNTER — Other Ambulatory Visit: Payer: Self-pay | Admitting: Urology

## 2024-06-14 DIAGNOSIS — N390 Urinary tract infection, site not specified: Secondary | ICD-10-CM

## 2024-08-15 NOTE — Progress Notes (Unsigned)
 "    08/16/2024 6:12 PM   Claudia Norris 1956-05-28 [id number]  Referring provider: Sherial Bail, MD [address] [place],  [place] [address]  Urological history: 1. rUTI's - RUS (01/2024) NED  - October 07, 2023 - less than 10,000 colonies - August 05, 2023 - no growth - July 30, 2023 - mixed urogenital flora less than 10,000 colonies - July 17, 2023 - beta-hemolytic Streptococcus, group B - July 09, 2023 - less than 10,000 colonies - July 02, 2023 -E. coli and Enterococcus faecalis - vaginal estrogen cream and cranberry tablets   No chief complaint on file.  HPI: Claudia Norris is a 69 y.o. woman who presents today for follow up.    Previous records reviewed.   They are having (1 to 7) or (8 or more) daytime voids,  they are having nocturia (1-2) or (3 or more) and urgency is (none, mild, strong, severe).   They are having (stress, urge or mixed incontinence.)    they are having urinary leakage (1-2 times weekly, 3 or more times weekly, 1-2 times daily and 3 or more times daily) They are using absorbent products for leakage (no, sometimes, always )   the type of products they use are (panty liners, absorbant pads, depends) *** daily.  They are not limiting fluids.  They are not engaging in toilet mapping  ***  UA ***  PVR ***  Serum creatinine (04/2024) 0.7  Hemoglobin A1c  (04/2024) 5.9   PMH: Past Medical History:  Diagnosis Date   GERD (gastroesophageal reflux disease)    Headache    tension   Motion sickness    car   Sleep apnea    no cpap   Thyroid  disease    Vertigo    in past    Surgical History: Past Surgical History:  Procedure Laterality Date   ABDOMINAL HYSTERECTOMY     CESAREAN SECTION     COLON SURGERY  2009   removed some of colon   COLONOSCOPY WITH PROPOFOL  N/A 04/18/2016   Procedure: COLONOSCOPY WITH PROPOFOL ;  Surgeon: Rogelia Copping, MD;  Location: [facility];  Service: Endoscopy;  Laterality: N/A;  sleep  apnea   COLONOSCOPY WITH PROPOFOL  N/A 09/11/2023   Procedure: COLONOSCOPY WITH PROPOFOL ;  Surgeon: Copping Rogelia, MD;  Location: [facility];  Service: Endoscopy;  Laterality: N/A;   ESOPHAGOGASTRODUODENOSCOPY N/A 09/11/2023   Procedure: ESOPHAGOGASTRODUODENOSCOPY (EGD);  Surgeon: Copping Rogelia, MD;  Location: [facility] END[facility];  Service: Endoscopy;  Laterality: N/A;    Home Medications:  Allergies as of 08/16/2024   No Known Allergies      Medication List        Accurate as of August 15, 2024  6:12 PM. If you have any questions, ask your nurse or doctor.          BIOTIN PO Take by mouth.   ciprofloxacin  500 MG tablet Commonly known as: CIPRO  Take 1 tablet (500 mg total) by mouth every 12 (twelve) hours.   CRANBERRY EXTRACT PO Take by mouth.   cyanocobalamin  500 MCG tablet Commonly known as: VITAMIN B12 Take 500 mcg by mouth daily.   estradiol  0.1 MG/GM vaginal cream Commonly known as: ESTRACE  Estrogen Cream Instruction Discard applicator Apply pea sized amount to tip of finger to urethra before bed. Wash hands well after application. Use Monday, Wednesday and Friday   methenamine  1 g tablet Commonly known as: HIPREX  TAKE 1 TABLET (1 G TOTAL) BY MOUTH 2 (TWO) TIMES DAILY WITH  A MEAL.   Synthroid  75 MCG tablet Generic drug: levothyroxine  TAKE 1 TABLET BY MOUTH EVERY DAY   Vitamin D  (Ergocalciferol ) 1.25 MG (50000 UNIT) Caps capsule Commonly known as: DRISDOL  TAKE 1 CAPSULE (50,000 UNITS TOTAL) BY MOUTH EVERY 7 (SEVEN) DAYS.        Allergies: No Known Allergies  Family History: Family History  Problem Relation Age of Onset   Hypertension Mother    Hypothyroidism Mother    Heart disease Mother        bypass   Stroke Mother    Cancer - Other Mother        rectal   Arthritis Father    Dementia Father    Breast cancer Maternal Aunt        mat great aunt    Social History: See HPI for pertinent social history  ROS: Pertinent ROS in HPI  Physical  Exam: There were no vitals taken for this visit.  Constitutional:  Well nourished. Alert and oriented, No acute distress. HEENT: [place] AT, moist mucus membranes.  Trachea midline, no masses. Cardiovascular: No clubbing, cyanosis, or edema. Respiratory: Normal respiratory effort, no increased work of breathing. GU: No CVA tenderness.  No bladder fullness or masses.  Recession of labia minora, dry, pale vulvar vaginal mucosa and loss of mucosal ridges and folds.  Normal urethral meatus, no lesions, no prolapse, no discharge.   No urethral masses, tenderness and/or tenderness. No bladder fullness, tenderness or masses. *** vagina mucosa, *** estrogen effect, no discharge, no lesions, *** pelvic support, *** cystocele and *** rectocele noted.  No cervical motion tenderness.  Uterus is freely mobile and non-fixed.  No adnexal/parametria masses or tenderness noted.  Anus and perineum are without rashes or lesions.   ***  Neurologic: Grossly intact, no focal deficits, moving all 4 extremities. Psychiatric: Normal mood and affect.    Laboratory Data: See EPIC and HPI  I have reviewed the labs.   Pertinent Imaging: ***  Assessment & Plan:    1. Recurrent UTI (Primary) - criteria for recurrent UTI has been met with 2 or more infections in 6 months or 3 or greater infections in one year  - continue vaginal estrogen cream as it is first-line therapy for postmenopausal women  - patient is instructed to increase their water  intake until the urine is pale yellow or clear (10 to 12 cups daily)   - taking probiotics that include  Lactobacillus crispatus in premenopausal women and oral capsules with Lactobacillus rhamnosus GR-1 and Lactobacillus reuteri RC-14 in postmenopausal women *** - continue Hiprex  1 gram twice daily if creatinine clearance is > 30   - could consider D-mannose or cranberry products, but evidence is weak with contradictory findings ***  - address constipation issues ***  - if using  tampons, she should remove them prior to urinating and change them often ***  Estrace  /Premarin cream topically- peasized apply topically three times a week * Cetaphil to clean the genital area ( not soap)  * Cranberry supplement (-Knudsen cranberry concentrate- 1 ounce mixed with 8 ounces of water  *wash with water  after bowel movt *Probiotic for vaginal health( can try Pearls vaginal health) * Increase water  consumption- 8 glasses a day *Avoid antibiotics unless systemic infection/ or before cystoscopy * Kegel Exercise to strengthen pelvic floor  2. GSM - Encouraged continual use of her vaginal estrogen cream 3 nights weekly  3. Incomplete bladder emptying - she is not interested in self cathing at this time to help facilitate better bladder emptying  No follow-ups on file.  These notes generated with voice recognition software. I apologize for typographical errors.  Claudia Norris  [facility] [address]  [address] [place], [place] [address] [phone]  "

## 2024-08-16 ENCOUNTER — Ambulatory Visit: Admitting: Urology

## 2024-08-16 ENCOUNTER — Encounter: Payer: Self-pay | Admitting: Urology

## 2024-08-16 VITALS — BP 144/80 | HR 98 | Wt 208.0 lb

## 2024-08-16 DIAGNOSIS — N952 Postmenopausal atrophic vaginitis: Secondary | ICD-10-CM

## 2024-08-16 DIAGNOSIS — R339 Retention of urine, unspecified: Secondary | ICD-10-CM

## 2024-08-16 DIAGNOSIS — N390 Urinary tract infection, site not specified: Secondary | ICD-10-CM | POA: Diagnosis not present

## 2024-08-16 LAB — MICROSCOPIC EXAMINATION

## 2024-08-16 LAB — URINALYSIS, COMPLETE
Bilirubin, UA: NEGATIVE
Glucose, UA: NEGATIVE
Ketones, UA: NEGATIVE
Nitrite, UA: NEGATIVE
Protein,UA: NEGATIVE
Specific Gravity, UA: 1.01 (ref 1.005–1.030)
Urobilinogen, Ur: 0.2 mg/dL (ref 0.2–1.0)
pH, UA: 6.5 (ref 5.0–7.5)

## 2024-08-16 LAB — BLADDER SCAN AMB NON-IMAGING

## 2024-09-05 ENCOUNTER — Ambulatory Visit: Admitting: Surgery

## 2025-08-16 ENCOUNTER — Ambulatory Visit: Admitting: Urology
